# Patient Record
Sex: Female | Born: 1965 | Race: White | Hispanic: No | Marital: Married | Smoking: Never smoker
Health system: Southern US, Community
[De-identification: ages and names within clinical notes are randomized; demographics above are authoritative.]

## PROBLEM LIST (undated history)

## (undated) DIAGNOSIS — F32A Depression, unspecified: Secondary | ICD-10-CM

## (undated) DIAGNOSIS — M1990 Unspecified osteoarthritis, unspecified site: Secondary | ICD-10-CM

## (undated) DIAGNOSIS — I10 Essential (primary) hypertension: Secondary | ICD-10-CM

## (undated) DIAGNOSIS — T4145XA Adverse effect of unspecified anesthetic, initial encounter: Secondary | ICD-10-CM

## (undated) DIAGNOSIS — Z8489 Family history of other specified conditions: Secondary | ICD-10-CM

## (undated) DIAGNOSIS — F329 Major depressive disorder, single episode, unspecified: Secondary | ICD-10-CM

## (undated) DIAGNOSIS — D649 Anemia, unspecified: Secondary | ICD-10-CM

## (undated) DIAGNOSIS — K219 Gastro-esophageal reflux disease without esophagitis: Secondary | ICD-10-CM

## (undated) DIAGNOSIS — T8859XA Other complications of anesthesia, initial encounter: Secondary | ICD-10-CM

## (undated) HISTORY — PX: TONSILLECTOMY: SUR1361

---
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
## 1995-05-21 HISTORY — PX: CHOLECYSTECTOMY: SHX55

## 1996-12-14 HISTORY — PX: CARPAL TUNNEL RELEASE: SHX101

## 2002-12-14 HISTORY — PX: NASAL SINUS SURGERY: SHX719

## 2006-12-14 HISTORY — PX: GASTRIC BYPASS: SHX52

## 2017-06-07 ENCOUNTER — Other Ambulatory Visit: Payer: Self-pay | Admitting: Orthopedic Surgery

## 2017-06-07 DIAGNOSIS — M5416 Radiculopathy, lumbar region: Secondary | ICD-10-CM

## 2017-06-08 ENCOUNTER — Ambulatory Visit
Admission: RE | Admit: 2017-06-08 | Discharge: 2017-06-08 | Disposition: A | Discharge status: Home / Self Care | Payer: BC Managed Care – PPO | Source: Ambulatory Visit | Attending: Orthopedic Surgery | Admitting: Orthopedic Surgery

## 2017-06-08 ENCOUNTER — Encounter: Payer: Self-pay | Admitting: Radiology

## 2017-06-08 DIAGNOSIS — M5416 Radiculopathy, lumbar region: Secondary | ICD-10-CM

## 2017-06-08 MED ORDER — IOPAMIDOL (ISOVUE-M 200) INJECTION 41%
1.0000 mL | Freq: Once | INTRAMUSCULAR | Status: AC
  Administered 2017-06-08: 1 mL via EPIDURAL

## 2017-06-08 MED ORDER — METHYLPREDNISOLONE ACETATE 40 MG/ML INJ SUSP (RADIOLOG
120.0000 mg | Freq: Once | INTRAMUSCULAR | Status: AC
  Administered 2017-06-08: 120 mg via EPIDURAL

## 2017-06-08 NOTE — Discharge Instructions (Signed)

## 2017-06-28 ENCOUNTER — Other Ambulatory Visit: Payer: Self-pay | Admitting: Orthopedic Surgery

## 2017-06-30 ENCOUNTER — Encounter (HOSPITAL_COMMUNITY): Payer: Self-pay

## 2017-06-30 ENCOUNTER — Encounter (HOSPITAL_COMMUNITY)
Admission: RE | Admit: 2017-06-30 | Discharge: 2017-06-30 | Disposition: A | Discharge status: Home / Self Care | Payer: BC Managed Care – PPO | Source: Ambulatory Visit | Attending: Orthopedic Surgery | Admitting: Orthopedic Surgery

## 2017-06-30 DIAGNOSIS — Z888 Allergy status to other drugs, medicaments and biological substances status: Secondary | ICD-10-CM | POA: Diagnosis not present

## 2017-06-30 DIAGNOSIS — M48061 Spinal stenosis, lumbar region without neurogenic claudication: Secondary | ICD-10-CM | POA: Diagnosis not present

## 2017-06-30 DIAGNOSIS — M5116 Intervertebral disc disorders with radiculopathy, lumbar region: Secondary | ICD-10-CM | POA: Diagnosis not present

## 2017-06-30 DIAGNOSIS — F329 Major depressive disorder, single episode, unspecified: Secondary | ICD-10-CM | POA: Diagnosis not present

## 2017-06-30 DIAGNOSIS — M069 Rheumatoid arthritis, unspecified: Secondary | ICD-10-CM | POA: Diagnosis not present

## 2017-06-30 DIAGNOSIS — K219 Gastro-esophageal reflux disease without esophagitis: Secondary | ICD-10-CM | POA: Diagnosis not present

## 2017-06-30 DIAGNOSIS — Z79899 Other long term (current) drug therapy: Secondary | ICD-10-CM | POA: Diagnosis not present

## 2017-06-30 DIAGNOSIS — Z01818 Encounter for other preprocedural examination: Secondary | ICD-10-CM

## 2017-06-30 DIAGNOSIS — I10 Essential (primary) hypertension: Secondary | ICD-10-CM | POA: Diagnosis not present

## 2017-06-30 HISTORY — DX: Family history of other specified conditions: Z84.89

## 2017-06-30 HISTORY — DX: Gastro-esophageal reflux disease without esophagitis: K21.9

## 2017-06-30 HISTORY — DX: Unspecified osteoarthritis, unspecified site: M19.90

## 2017-06-30 HISTORY — DX: Major depressive disorder, single episode, unspecified: F32.9

## 2017-06-30 HISTORY — DX: Depression, unspecified: F32.A

## 2017-06-30 HISTORY — DX: Other complications of anesthesia, initial encounter: T88.59XA

## 2017-06-30 HISTORY — DX: Essential (primary) hypertension: I10

## 2017-06-30 HISTORY — DX: Anemia, unspecified: D64.9

## 2017-06-30 HISTORY — DX: Adverse effect of unspecified anesthetic, initial encounter: T41.45XA

## 2017-06-30 LAB — COMPREHENSIVE METABOLIC PANEL
ALT: 26 U/L (ref 14–54)
ANION GAP: 11 (ref 5–15)
AST: 34 U/L (ref 15–41)
Albumin: 4.3 g/dL (ref 3.5–5.0)
Alkaline Phosphatase: 69 U/L (ref 38–126)
BUN: 15 mg/dL (ref 6–20)
CHLORIDE: 101 mmol/L (ref 101–111)
CO2: 28 mmol/L (ref 22–32)
Calcium: 9.4 mg/dL (ref 8.9–10.3)
Creatinine, Ser: 0.91 mg/dL (ref 0.44–1.00)
Glucose, Bld: 91 mg/dL (ref 65–99)
POTASSIUM: 3.8 mmol/L (ref 3.5–5.1)
SODIUM: 140 mmol/L (ref 135–145)
Total Bilirubin: 0.6 mg/dL (ref 0.3–1.2)
Total Protein: 7.1 g/dL (ref 6.5–8.1)

## 2017-06-30 LAB — URINALYSIS, ROUTINE W REFLEX MICROSCOPIC
Bacteria, UA: NONE SEEN
Bilirubin Urine: NEGATIVE
GLUCOSE, UA: NEGATIVE mg/dL
HGB URINE DIPSTICK: NEGATIVE
Ketones, ur: NEGATIVE mg/dL
Nitrite: NEGATIVE
PROTEIN: NEGATIVE mg/dL
SPECIFIC GRAVITY, URINE: 1.003 — AB (ref 1.005–1.030)
SQUAMOUS EPITHELIAL / LPF: NONE SEEN
pH: 5 (ref 5.0–8.0)

## 2017-06-30 LAB — SURGICAL PCR SCREEN
MRSA, PCR: NEGATIVE
STAPHYLOCOCCUS AUREUS: POSITIVE — AB

## 2017-06-30 LAB — CBC WITH DIFFERENTIAL/PLATELET
Basophils Absolute: 0 10*3/uL (ref 0.0–0.1)
Basophils Relative: 0 %
EOS ABS: 0.1 10*3/uL (ref 0.0–0.7)
EOS PCT: 2 %
HCT: 44.9 % (ref 36.0–46.0)
Hemoglobin: 14.8 g/dL (ref 12.0–15.0)
LYMPHS ABS: 1.5 10*3/uL (ref 0.7–4.0)
Lymphocytes Relative: 24 %
MCH: 31.8 pg (ref 26.0–34.0)
MCHC: 33 g/dL (ref 30.0–36.0)
MCV: 96.6 fL (ref 78.0–100.0)
MONO ABS: 0.8 10*3/uL (ref 0.1–1.0)
Monocytes Relative: 13 %
Neutro Abs: 3.8 10*3/uL (ref 1.7–7.7)
Neutrophils Relative %: 61 %
PLATELETS: 254 10*3/uL (ref 150–400)
RBC: 4.65 MIL/uL (ref 3.87–5.11)
RDW: 12.8 % (ref 11.5–15.5)
WBC: 6.2 10*3/uL (ref 4.0–10.5)

## 2017-06-30 LAB — APTT: aPTT: 24 seconds (ref 24–36)

## 2017-06-30 LAB — TYPE AND SCREEN
ABO/RH(D): O POS
Antibody Screen: NEGATIVE

## 2017-06-30 LAB — PROTIME-INR
INR: 0.92
PROTHROMBIN TIME: 12.3 s (ref 11.4–15.2)

## 2017-06-30 LAB — HCG, SERUM, QUALITATIVE: Preg, Serum: NEGATIVE

## 2017-06-30 LAB — ABO/RH: ABO/RH(D): O POS

## 2017-06-30 NOTE — H&P (Signed)
PREOPERATIVE H&P  Chief Complaint: Right leg pain  HPI: Latoya Smith is a 51 y.o. 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 No past surgical history on file. Social History   Social History  . Marital status: Married    Spouse name: N/A  . Number of children: N/A  . Years of education: N/A   Social History Main Topics  . Smoking status: Not on file  . Smokeless tobacco: Not on file  . Alcohol use Not on file  . Drug use: Unknown  . Sexual activity: Not on file   Other Topics Concern  . Not on file   Social History Narrative  . No narrative on file   No family history on file. Allergies  Allergen Reactions  . Valium [Diazepam] Other (See Comments)    Cries uncontrollably    Prior to Admission medications   Medication Sig Start Date End Date Taking? Authorizing Provider  acyclovir (ZOVIRAX) 400 MG tablet Take 400 mg by mouth every other day.   Yes [provider]  buPROPion (WELLBUTRIN) 100 MG tablet Take 100 mg by mouth 2 (two) times daily.   Yes [provider]  Calcium Citrate-Vitamin D (CELEBRATE CALCIUM PLUS 500 PO) Take 1-2 tablets by mouth See admin instructions. Takes 1 tablet in morning and 2 tablet in the evening   Yes [provider]  celecoxib (CELEBREX) 200 MG capsule Take 200 mg by mouth daily.   Yes [provider]  cyclobenzaprine (FLEXERIL) 5 MG tablet Take 5 mg by mouth at bedtime.   Yes [provider]  diphenhydrAMINE (BENADRYL) 25 mg capsule Take 50 mg by mouth at bedtime.   Yes [provider]  hydrochlorothiazide (MICROZIDE) 12.5 MG capsule Take 12.5 mg by mouth daily.   Yes [provider]    HYDROcodone-acetaminophen (NORCO/VICODIN) 5-325 MG tablet take 1 to 2 tablets by mouth every 6 to  8 HOURS AS NEEDED FOR PAIN 06/24/17  Yes [provider]  leflunomide (ARAVA) 20 MG tablet Take 20 mg by mouth at bedtime.   Yes [provider]  levonorgestrel (MIRENA) 20 MCG/24HR IUD 1 each by Intrauterine route once.   Yes [provider]  loratadine (CLARITIN) 10 MG tablet Take 10 mg by mouth daily.   Yes [provider]  losartan (COZAAR) 50 MG tablet Take 25 mg by mouth daily. Takes 0.5 tablets   Yes [provider]  Multiple Vitamins-Minerals (CELEBRATE MULTI-COMPLETE 18 PO) Take 1 tablet by mouth 2 (two) times daily.   Yes [provider]  omeprazole (PRILOSEC) 20 MG capsule Take 20 mg by mouth daily.   Yes [provider]  predniSONE (DELTASONE) 5 MG tablet Take 5 mg by mouth daily with breakfast.   Yes [provider]  Tocilizumab (ACTEMRA IV) Inject 8 mg/kg into the vein every 30 (thirty) days. Last infusion was given 05-26-17   Yes [provider]  zolpidem (AMBIEN) 10 MG tablet Take 10 mg by mouth at bedtime.   Yes [provider]     All other systems have been reviewed and were otherwise negative with the exception of those mentioned in the HPI and as above.  Physical Exam: There were no vitals filed for this visit.  General: Alert, no acute distress Cardiovascular: No pedal edema Respiratory: No cyanosis, no use of accessory musculature Skin: No lesions in the area of chief complaint Neurologic: Sensation intact distally Psychiatric: Patient is competent for consent with normal mood and affect Lymphatic: No axillary or cervical lymphadenopathy  MUSCULOSKELETAL: + SLR on right   Assessment/Plan: RIGHT LEG PAIN Plan for Procedure(s): RIGHT SIDED LUMBAR 4-5 MICRODISECTOMY.   Emilee Hero, MD 06/30/2017 12:05 PM

## 2017-06-30 NOTE — Pre-Procedure Instructions (Addendum)
ANDREW SORIA  06/30/2017      [id number] [name], [place] - [address] [place] [address] Phone: [phone] Fax: [phone]    Your procedure is scheduled on 07/01/18  Report to [facility] Admitting at 1150 A.M.  Call this number if you have problems the morning of surgery:  [phone]   Remember:  Do not eat food or drink liquids after midnight.  Take these medicines the morning of surgery with A SIP OF WATER    Bupropion(wellbutrin),omeprazole(prilosec), Hydrocodone if needed,   Acyclovir if needed, prednisone  STOP all herbel meds, nsaids (aleve,naproxen,advil,ibuprofen)   prior to surgery starting NOW including all vitamins/supplements,aspirin,celebrex   Do not wear jewelry, make-up or nail polish.  Do not wear lotions, powders, or perfumes, or deoderant.  Do not shave 48 hours prior to surgery.  Men may shave face and neck.  Do not bring valuables to the hospital.  [facility] is not responsible for any belongings or valuables.  Contacts, dentures or bridgework may not be worn into surgery.  Leave your suitcase in the car.  After surgery it may be brought to your room.  For patients admitted to the hospital, discharge time will be determined by your treatment team.  Patients discharged the day of surgery will not be allowed to drive home.   Special instructions:   Special Instructions: [facility] - Preparing for Surgery  Before surgery, you can play an important role.  Because skin is not sterile, your skin needs to be as free of germs as possible.  You can reduce the number of germs on you skin by washing with CHG (chlorahexidine gluconate) soap before surgery.  CHG is an antiseptic cleaner which kills germs and bonds with the skin to continue killing germs even after washing.  Please DO NOT use if you have an allergy to CHG or antibacterial soaps.  If your skin becomes reddened/irritated  stop using the CHG and inform your nurse when you arrive at Short Stay.  Do not shave (including legs and underarms) for at least 48 hours prior to the first CHG shower.  You may shave your face.  Please follow these instructions carefully:   1.  Shower with CHG Soap the night before surgery and the morning of Surgery.  2.  If you choose to wash your hair, wash your hair first as usual with your normal shampoo.  3.  After you shampoo, rinse your hair and body thoroughly to remove the Shampoo.  4.  Use CHG as you would any other liquid soap.  You can apply chg directly  to the skin and wash gently with scrungie or a clean washcloth.  5.  Apply the CHG Soap to your body ONLY FROM THE NECK DOWN.  Do not use on open wounds or open sores.  Avoid contact with your eyes ears, mouth and genitals (private parts).  Wash genitals (private parts)       with your normal soap.  6.  Wash thoroughly, paying special attention to the area where your surgery will be performed.  7.  Thoroughly rinse your body with warm water from the neck down.  8.  DO NOT shower/wash with your normal soap after using and rinsing off the CHG Soap.  9.  Pat yourself dry with a clean towel.            10.  Wear clean pajamas.  11.  Place clean sheets on your bed the night of your first shower and do not sleep with pets.  Day of Surgery  Do not apply any lotions/deodorants the morning of surgery.  Please wear clean clothes to the hospital/surgery center.  Please read over the fact sheets that you were given.

## 2017-07-01 ENCOUNTER — Ambulatory Visit (HOSPITAL_COMMUNITY): Payer: BC Managed Care – PPO

## 2017-07-01 ENCOUNTER — Ambulatory Visit (HOSPITAL_COMMUNITY): Payer: BC Managed Care – PPO | Admitting: Anesthesiology

## 2017-07-01 ENCOUNTER — Ambulatory Visit (HOSPITAL_COMMUNITY)
Admission: RE | Disposition: A | Discharge status: Home / Self Care | Payer: Self-pay | Source: Ambulatory Visit | Attending: Orthopedic Surgery

## 2017-07-01 ENCOUNTER — Ambulatory Visit (HOSPITAL_COMMUNITY)
Admission: RE | Admit: 2017-07-01 | Discharge: 2017-07-01 | Disposition: A | Discharge status: Home / Self Care | Payer: BC Managed Care – PPO | Source: Ambulatory Visit | Attending: Orthopedic Surgery | Admitting: Orthopedic Surgery

## 2017-07-01 ENCOUNTER — Encounter (HOSPITAL_COMMUNITY): Payer: Self-pay | Admitting: Anesthesiology

## 2017-07-01 DIAGNOSIS — M5116 Intervertebral disc disorders with radiculopathy, lumbar region: Secondary | ICD-10-CM | POA: Insufficient documentation

## 2017-07-01 DIAGNOSIS — M5410 Radiculopathy, site unspecified: Secondary | ICD-10-CM

## 2017-07-01 DIAGNOSIS — F329 Major depressive disorder, single episode, unspecified: Secondary | ICD-10-CM | POA: Insufficient documentation

## 2017-07-01 DIAGNOSIS — K219 Gastro-esophageal reflux disease without esophagitis: Secondary | ICD-10-CM | POA: Insufficient documentation

## 2017-07-01 DIAGNOSIS — Z419 Encounter for procedure for purposes other than remedying health state, unspecified: Secondary | ICD-10-CM

## 2017-07-01 DIAGNOSIS — M069 Rheumatoid arthritis, unspecified: Secondary | ICD-10-CM | POA: Insufficient documentation

## 2017-07-01 DIAGNOSIS — M48061 Spinal stenosis, lumbar region without neurogenic claudication: Secondary | ICD-10-CM | POA: Insufficient documentation

## 2017-07-01 DIAGNOSIS — Z888 Allergy status to other drugs, medicaments and biological substances status: Secondary | ICD-10-CM | POA: Insufficient documentation

## 2017-07-01 DIAGNOSIS — I10 Essential (primary) hypertension: Secondary | ICD-10-CM | POA: Insufficient documentation

## 2017-07-01 DIAGNOSIS — Z79899 Other long term (current) drug therapy: Secondary | ICD-10-CM | POA: Insufficient documentation

## 2017-07-01 HISTORY — PX: LUMBAR LAMINECTOMY: SHX95

## 2017-07-01 SURGERY — MICRODISCECTOMY LUMBAR LAMINECTOMY
Anesthesia: General | Site: Back | Laterality: Right

## 2017-07-01 MED ORDER — HYDROMORPHONE HCL 1 MG/ML IJ SOLN
INTRAMUSCULAR | Status: AC
  Filled 2017-07-01: qty 0.5

## 2017-07-01 MED ORDER — FENTANYL CITRATE (PF) 100 MCG/2ML IJ SOLN
INTRAMUSCULAR | Status: DC | PRN
  Administered 2017-07-01: 100 ug via INTRAVENOUS
  Administered 2017-07-01 (×3): 50 ug via INTRAVENOUS

## 2017-07-01 MED ORDER — PHENYLEPHRINE HCL 10 MG/ML IJ SOLN
INTRAMUSCULAR | Status: DC | PRN
  Administered 2017-07-01 (×5): 80 ug via INTRAVENOUS

## 2017-07-01 MED ORDER — ROCURONIUM BROMIDE 100 MG/10ML IV SOLN
INTRAVENOUS | Status: DC | PRN
  Administered 2017-07-01: 5 mg via INTRAVENOUS
  Administered 2017-07-01: 45 mg via INTRAVENOUS

## 2017-07-01 MED ORDER — METHYLPREDNISOLONE ACETATE 40 MG/ML IJ SUSP
INTRAMUSCULAR | Status: DC | PRN
  Administered 2017-07-01: 40 mg

## 2017-07-01 MED ORDER — DEXAMETHASONE SODIUM PHOSPHATE 10 MG/ML IJ SOLN
INTRAMUSCULAR | Status: DC | PRN
  Administered 2017-07-01: 10 mg via INTRAVENOUS

## 2017-07-01 MED ORDER — CEFAZOLIN SODIUM-DEXTROSE 2-4 GM/100ML-% IV SOLN
2.0000 g | INTRAVENOUS | Status: AC
  Administered 2017-07-01: 2 g via INTRAVENOUS
  Filled 2017-07-01: qty 100

## 2017-07-01 MED ORDER — PROPOFOL 10 MG/ML IV BOLUS
INTRAVENOUS | Status: DC | PRN
  Administered 2017-07-01: 180 mg via INTRAVENOUS

## 2017-07-01 MED ORDER — HYDROCODONE-ACETAMINOPHEN 5-325 MG PO TABS
ORAL_TABLET | ORAL | Status: AC
  Administered 2017-07-01: 1 via ORAL
  Filled 2017-07-01: qty 1

## 2017-07-01 MED ORDER — BUPIVACAINE LIPOSOME 1.3 % IJ SUSP
INTRAMUSCULAR | Status: DC | PRN
  Administered 2017-07-01: 20 mL

## 2017-07-01 MED ORDER — HYDROCODONE-ACETAMINOPHEN 5-325 MG PO TABS
1.0000 | ORAL_TABLET | Freq: Once | ORAL | Status: AC
  Administered 2017-07-01: 1 via ORAL

## 2017-07-01 MED ORDER — THROMBIN 20000 UNITS EX SOLR
CUTANEOUS | Status: AC
  Filled 2017-07-01: qty 20000

## 2017-07-01 MED ORDER — BUPIVACAINE LIPOSOME 1.3 % IJ SUSP
20.0000 mL | Freq: Once | INTRAMUSCULAR | Status: DC
  Filled 2017-07-01: qty 20

## 2017-07-01 MED ORDER — METHYLENE BLUE 0.5 % INJ SOLN
INTRAVENOUS | Status: AC
  Filled 2017-07-01: qty 10

## 2017-07-01 MED ORDER — SUGAMMADEX SODIUM 200 MG/2ML IV SOLN
INTRAVENOUS | Status: DC | PRN
  Administered 2017-07-01: 150 mg via INTRAVENOUS

## 2017-07-01 MED ORDER — LACTATED RINGERS IV SOLN
INTRAVENOUS | Status: DC
  Administered 2017-07-01 (×2): via INTRAVENOUS

## 2017-07-01 MED ORDER — METHYLENE BLUE 0.5 % INJ SOLN
INTRAVENOUS | Status: DC | PRN
  Administered 2017-07-01: 1 mL

## 2017-07-01 MED ORDER — SCOPOLAMINE 1 MG/3DAYS TD PT72
1.0000 | MEDICATED_PATCH | TRANSDERMAL | Status: DC
  Administered 2017-07-01: 1.5 mg via TRANSDERMAL
  Filled 2017-07-01: qty 1

## 2017-07-01 MED ORDER — LIDOCAINE HCL (CARDIAC) 20 MG/ML IV SOLN
INTRAVENOUS | Status: DC | PRN
  Administered 2017-07-01: 100 mg via INTRAVENOUS

## 2017-07-01 MED ORDER — CHLORHEXIDINE GLUCONATE 4 % EX LIQD: 60.0000 mL | Freq: Once | CUTANEOUS | Status: DC

## 2017-07-01 MED ORDER — CYCLOBENZAPRINE HCL 10 MG PO TABS
ORAL_TABLET | ORAL | Status: AC
  Administered 2017-07-01: 10 mg via ORAL
  Filled 2017-07-01: qty 1

## 2017-07-01 MED ORDER — CYCLOBENZAPRINE HCL 10 MG PO TABS
10.0000 mg | ORAL_TABLET | Freq: Once | ORAL | Status: AC
  Administered 2017-07-01: 10 mg via ORAL

## 2017-07-01 MED ORDER — PHENYLEPHRINE HCL 10 MG/ML IJ SOLN
INTRAMUSCULAR | Status: DC | PRN
  Administered 2017-07-01: 30 ug/min via INTRAVENOUS

## 2017-07-01 MED ORDER — METHYLPREDNISOLONE ACETATE 40 MG/ML IJ SUSP
INTRAMUSCULAR | Status: AC
  Filled 2017-07-01: qty 1

## 2017-07-01 MED ORDER — MIDAZOLAM HCL 5 MG/5ML IJ SOLN
INTRAMUSCULAR | Status: DC | PRN
  Administered 2017-07-01: 2 mg via INTRAVENOUS

## 2017-07-01 MED ORDER — PROMETHAZINE HCL 25 MG/ML IJ SOLN: 6.2500 mg | INTRAMUSCULAR | Status: DC | PRN

## 2017-07-01 MED ORDER — HYDROMORPHONE HCL 1 MG/ML IJ SOLN
INTRAMUSCULAR | Status: AC
  Administered 2017-07-01: 0.5 mg via INTRAVENOUS
  Filled 2017-07-01: qty 1

## 2017-07-01 MED ORDER — BUPIVACAINE-EPINEPHRINE 0.25% -1:200000 IJ SOLN
INTRAMUSCULAR | Status: DC | PRN
  Administered 2017-07-01: 30 mL

## 2017-07-01 MED ORDER — ONDANSETRON HCL 4 MG/2ML IJ SOLN
INTRAMUSCULAR | Status: DC | PRN
  Administered 2017-07-01: 4 mg via INTRAVENOUS

## 2017-07-01 MED ORDER — HYDROMORPHONE HCL 1 MG/ML IJ SOLN
0.2500 mg | INTRAMUSCULAR | Status: DC | PRN
  Administered 2017-07-01 (×2): 0.5 mg via INTRAVENOUS

## 2017-07-01 SURGICAL SUPPLY — 66 items
BENZOIN TINCTURE PRP APPL 2/3 (GAUZE/BANDAGES/DRESSINGS) IMPLANT
BUR ROUND PRECISION 4.0 (BURR) ×2 IMPLANT
BUR ROUND PRECISION 4.0MM (BURR) ×1
CANISTER SUCT 3000ML PPV (MISCELLANEOUS) ×3 IMPLANT
CLOSURE STERI-STRIP 1/2X4 (GAUZE/BANDAGES/DRESSINGS) ×1
CLOSURE WOUND 1/2 X4 (GAUZE/BANDAGES/DRESSINGS)
CLSR STERI-STRIP ANTIMIC 1/2X4 (GAUZE/BANDAGES/DRESSINGS) ×2 IMPLANT
CONT SPEC 4OZ CLIKSEAL STRL BL (MISCELLANEOUS) ×3 IMPLANT
COVER SURGICAL LIGHT HANDLE (MISCELLANEOUS) ×3 IMPLANT
DRAIN CHANNEL 10F 3/8 F FF (DRAIN) IMPLANT
DRAPE POUCH INSTRU U-SHP 10X18 (DRAPES) ×6 IMPLANT
DRAPE SURG 17X23 STRL (DRAPES) ×12 IMPLANT
DURAPREP 26ML APPLICATOR (WOUND CARE) ×3 IMPLANT
ELECT BLADE 4.0 EZ CLEAN MEGAD (MISCELLANEOUS) ×3
ELECT BLADE 6.5 EXT (BLADE) IMPLANT
ELECT CAUTERY BLADE 6.4 (BLADE) ×3 IMPLANT
ELECT REM PT RETURN 9FT ADLT (ELECTROSURGICAL) ×3
ELECTRODE BLDE 4.0 EZ CLN MEGD (MISCELLANEOUS) ×1 IMPLANT
ELECTRODE REM PT RTRN 9FT ADLT (ELECTROSURGICAL) ×1 IMPLANT
EVACUATOR SILICONE 100CC (DRAIN) IMPLANT
FILTER STRAW FLUID ASPIR (MISCELLANEOUS) ×3 IMPLANT
GAUZE SPONGE 4X4 12PLY STRL (GAUZE/BANDAGES/DRESSINGS) ×3 IMPLANT
GAUZE SPONGE 4X4 16PLY XRAY LF (GAUZE/BANDAGES/DRESSINGS) ×6 IMPLANT
GLOVE BIO SURGEON STRL SZ7 (GLOVE) ×3 IMPLANT
GLOVE BIO SURGEON STRL SZ8 (GLOVE) ×3 IMPLANT
GLOVE BIOGEL PI IND STRL 7.0 (GLOVE) ×1 IMPLANT
GLOVE BIOGEL PI IND STRL 8 (GLOVE) ×1 IMPLANT
GLOVE BIOGEL PI INDICATOR 7.0 (GLOVE) ×2
GLOVE BIOGEL PI INDICATOR 8 (GLOVE) ×2
GOWN STRL REUS W/ TWL LRG LVL3 (GOWN DISPOSABLE) ×2 IMPLANT
GOWN STRL REUS W/TWL LRG LVL3 (GOWN DISPOSABLE) ×4
IV CATH 14GX2 1/4 (CATHETERS) ×3 IMPLANT
KIT BASIN OR (CUSTOM PROCEDURE TRAY) ×3 IMPLANT
KIT ROOM TURNOVER OR (KITS) ×3 IMPLANT
MARKER SKIN DUAL TIP RULER LAB (MISCELLANEOUS) ×3 IMPLANT
NEEDLE 18GX1X1/2 (RX/OR ONLY) (NEEDLE) ×3 IMPLANT
NEEDLE 22X1 1/2 (OR ONLY) (NEEDLE) ×3 IMPLANT
NEEDLE HYPO 25GX1X1/2 BEV (NEEDLE) ×3 IMPLANT
NEEDLE SPNL 18GX3.5 QUINCKE PK (NEEDLE) ×6 IMPLANT
NEEDLE SPNL 22GX3.5 QUINCKE BK (NEEDLE) IMPLANT
NS IRRIG 1000ML POUR BTL (IV SOLUTION) ×3 IMPLANT
PACK LAMINECTOMY ORTHO (CUSTOM PROCEDURE TRAY) ×3 IMPLANT
PACK UNIVERSAL I (CUSTOM PROCEDURE TRAY) ×3 IMPLANT
PAD ARMBOARD 7.5X6 YLW CONV (MISCELLANEOUS) ×6 IMPLANT
PATTIES SURGICAL .5 X.5 (GAUZE/BANDAGES/DRESSINGS) IMPLANT
PATTIES SURGICAL .5 X1 (DISPOSABLE) ×3 IMPLANT
PATTIES SURGICAL 1X1 (DISPOSABLE) IMPLANT
SPONGE SURGIFOAM ABS GEL 100 (HEMOSTASIS) ×3 IMPLANT
STRIP CLOSURE SKIN 1/2X4 (GAUZE/BANDAGES/DRESSINGS) IMPLANT
SUT ETHILON 3 0 FSL (SUTURE) IMPLANT
SUT VIC AB 0 CT1 27 (SUTURE) ×2
SUT VIC AB 0 CT1 27XBRD ANBCTR (SUTURE) ×1 IMPLANT
SUT VIC AB 0 CT2 27 (SUTURE) ×3 IMPLANT
SUT VIC AB 1 CT1 18XCR BRD 8 (SUTURE) ×1 IMPLANT
SUT VIC AB 1 CT1 8-18 (SUTURE) ×2
SUT VIC AB 2-0 CT2 18 VCP726D (SUTURE) ×3 IMPLANT
SYR 20CC LL (SYRINGE) ×3 IMPLANT
SYR BULB IRRIGATION 50ML (SYRINGE) ×3 IMPLANT
SYR CONTROL 10ML LL (SYRINGE) ×3 IMPLANT
SYR TB 1ML 26GX3/8 SAFETY (SYRINGE) ×6 IMPLANT
SYR TB 1ML LUER SLIP (SYRINGE) ×6 IMPLANT
TAPE CLOTH SURG 4X10 WHT LF (GAUZE/BANDAGES/DRESSINGS) ×3 IMPLANT
TOWEL OR 17X24 6PK STRL BLUE (TOWEL DISPOSABLE) ×3 IMPLANT
TOWEL OR 17X26 10 PK STRL BLUE (TOWEL DISPOSABLE) ×3 IMPLANT
WATER STERILE IRR 1000ML POUR (IV SOLUTION) ×3 IMPLANT
YANKAUER SUCT BULB TIP NO VENT (SUCTIONS) ×3 IMPLANT

## 2017-07-01 NOTE — Anesthesia Preprocedure Evaluation (Addendum)
Anesthesia Evaluation  Patient identified by MRN, date of birth, ID band Patient awake    Reviewed: Allergy & Precautions, NPO status , Patient's Chart, lab work & pertinent test results  History of Anesthesia Complications Negative for: history of anesthetic complications  Airway Mallampati: II  TM Distance: >3 FB Neck ROM: Full    Dental no notable dental hx. (+) Dental Advisory Given, Teeth Intact   Pulmonary neg pulmonary ROS,    Pulmonary exam normal        Cardiovascular hypertension, Pt. on medications Normal cardiovascular exam     Neuro/Psych PSYCHIATRIC DISORDERS Depression negative neurological ROS     GI/Hepatic Neg liver ROS, GERD  Medicated and Controlled,  Endo/Other  negative endocrine ROS  Renal/GU negative Renal ROS  negative genitourinary   Musculoskeletal negative musculoskeletal ROS (+) Arthritis , Rheumatoid disorders,    Abdominal   Peds negative pediatric ROS (+)  Hematology negative hematology ROS (+)   Anesthesia Other Findings   Reproductive/Obstetrics negative OB ROS                            Anesthesia Physical Anesthesia Plan  ASA: II  Anesthesia Plan: General   Post-op Pain Management:    Induction: Intravenous  PONV Risk Score and Plan: 3 and Ondansetron, Dexamethasone and Scopolamine patch - Pre-op  Airway Management Planned: Oral ETT  Additional Equipment:   Intra-op Plan:   Post-operative Plan: Extubation in OR  Informed Consent: I have reviewed the patients History and Physical, chart, labs and discussed the procedure including the risks, benefits and alternatives for the proposed anesthesia with the patient or authorized representative who has indicated his/her understanding and acceptance.   Dental advisory given  Plan Discussed with: CRNA and Anesthesiologist  Anesthesia Plan Comments:         Anesthesia Quick  Evaluation

## 2017-07-01 NOTE — Anesthesia Postprocedure Evaluation (Signed)
Anesthesia Post Note  Patient: Latoya Smith  Procedure(s) Performed: Procedure(s) (LRB): RIGHT SIDED LUMBAR 4-5 MICRODISECTOMY. (Right)     Patient location during evaluation: PACU Anesthesia Type: General Level of consciousness: sedated Pain management: pain level controlled Vital Signs Assessment: post-procedure vital signs reviewed and stable Respiratory status: spontaneous breathing and respiratory function stable Cardiovascular status: stable Anesthetic complications: no    Last Vitals:  Vitals:   07/01/17 1615 07/01/17 1630  BP: (!) 147/86   Pulse: (!) 102 100  Resp: 19   Temp:  36.4 C    Last Pain:  Vitals:   07/01/17 1630  TempSrc:   PainSc: 3                  Amery Vandenbos DANIEL

## 2017-07-01 NOTE — H&P (Signed)
PREOPERATIVE H&P  Chief Complaint: Right leg pain  HPI: Latoya Smith is a 51 y.o. 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 No past surgical history on file. Social History        Social History  . Marital status: Married    Spouse name: N/A  . Number of children: N/A  . Years of education: N/A       Social History Main Topics  . Smoking status: Not on file  . Smokeless tobacco: Not on file  . Alcohol use Not on file  . Drug use: Unknown  . Sexual activity: Not on file   Other Topics Concern  . Not on file      Social History Narrative  . No narrative on file   No family history on file.      Allergies  Allergen Reactions  . Valium [Diazepam] Other (See Comments)    Cries uncontrollably           Prior to Admission medications   Medication Sig Start Date End Date Taking? Authorizing Provider  acyclovir (ZOVIRAX) 400 MG tablet Take 400 mg by mouth every other day.   Yes [provider]  buPROPion (WELLBUTRIN) 100 MG tablet Take 100 mg by mouth 2 (two) times daily.   Yes [provider]  Calcium Citrate-Vitamin D (CELEBRATE CALCIUM PLUS 500 PO) Take 1-2 tablets by mouth See admin instructions. Takes 1 tablet in morning and 2 tablet in the evening   Yes [provider]  celecoxib (CELEBREX) 200 MG capsule Take 200 mg by mouth daily.   Yes [provider]  cyclobenzaprine (FLEXERIL) 5 MG tablet Take 5 mg by mouth at bedtime.   Yes [provider]  diphenhydrAMINE (BENADRYL) 25 mg capsule Take 50 mg by mouth at bedtime.   Yes [provider]  hydrochlorothiazide (MICROZIDE) 12.5 MG capsule Take 12.5 mg by  mouth daily.   Yes [provider]  HYDROcodone-acetaminophen (NORCO/VICODIN) 5-325 MG tablet take 1 to 2 tablets by mouth every 6 to  8 HOURS AS NEEDED FOR PAIN 06/24/17  Yes [provider]  leflunomide (ARAVA) 20 MG tablet Take 20 mg by mouth at bedtime.   Yes [provider]  levonorgestrel (MIRENA) 20 MCG/24HR IUD 1 each by Intrauterine route once.   Yes [provider]  loratadine (CLARITIN) 10 MG tablet Take 10 mg by mouth daily.   Yes [provider]  losartan (COZAAR) 50 MG tablet Take 25 mg by mouth daily. Takes 0.5 tablets   Yes [provider]  Multiple Vitamins-Minerals (CELEBRATE MULTI-COMPLETE 18 PO) Take 1 tablet by mouth 2 (two) times daily.   Yes [provider]  omeprazole (PRILOSEC) 20 MG capsule Take 20 mg by mouth daily.   Yes [provider]  predniSONE (DELTASONE) 5 MG tablet Take 5 mg by mouth daily with breakfast.   Yes [provider]  Tocilizumab (ACTEMRA IV) Inject 8 mg/kg into the vein every 30 (thirty) days. Last infusion was given 05-26-17   Yes [provider]  zolpidem (AMBIEN) 10 MG tablet Take 10 mg by mouth at bedtime.   Yes [provider]     All other  systems have been reviewed and were otherwise negative with the exception of those mentioned in the HPI and as above.  Physical Exam: There were no vitals filed for this visit.  General: Alert, no acute distress Cardiovascular: No pedal edema Respiratory: No cyanosis, no use of accessory musculature Skin: No lesions in the area of chief complaint Neurologic: Sensation intact distally Psychiatric: Patient is competent for consent with normal mood and affect Lymphatic: No axillary or cervical lymphadenopathy  MUSCULOSKELETAL: + SLR on right   Assessment/Plan: RIGHT LEG PAIN Plan for Procedure(s): RIGHT SIDED LUMBAR 4-5 MICRODISECTOMY.

## 2017-07-01 NOTE — Anesthesia Procedure Notes (Signed)
Procedure Name: Intubation Date/Time: 07/01/2017 12:16 PM Performed by: Lovie Chol Pre-anesthesia Checklist: Patient identified, Emergency Drugs available, Suction available and Patient being monitored Patient Re-evaluated:Patient Re-evaluated prior to induction Oxygen Delivery Method: Circle System Utilized Preoxygenation: Pre-oxygenation with 100% oxygen Induction Type: IV induction Ventilation: Mask ventilation without difficulty Laryngoscope Size: Miller and 2 Grade View: Grade I Tube type: Oral Tube size: 7.0 mm Number of attempts: 1 Airway Equipment and Method: Stylet and Oral airway Placement Confirmation: ETT inserted through vocal cords under direct vision,  positive ETCO2 and breath sounds checked- equal and bilateral Secured at: 21 cm Tube secured with: Tape Dental Injury: Teeth and Oropharynx as per pre-operative assessment

## 2017-07-01 NOTE — Transfer of Care (Signed)
Immediate Anesthesia Transfer of Care Note  Patient: Latoya Smith  Procedure(s) Performed: Procedure(s) with comments: RIGHT SIDED LUMBAR 4-5 MICRODISECTOMY. (Right) - RIGHT SIDED LUMBAR 4-5 MICRODISECTOMY.  TIME REQUESTED 2 HRS.   Patient Location: PACU  Anesthesia Type:General  Level of Consciousness: awake, oriented and patient cooperative  Airway & Oxygen Therapy: Patient Spontanous Breathing and Patient connected to face mask oxygen  Post-op Assessment: Report given to RN and Post -op Vital signs reviewed and stable  Post vital signs: Reviewed  Last Vitals:  Vitals:   07/01/17 0937  BP: (!) 141/88  Pulse: 87  Resp: 20  Temp: 36.7 C    Last Pain:  Vitals:   07/01/17 1001  TempSrc:   PainSc: 5          Complications: No apparent anesthesia complications

## 2017-07-02 ENCOUNTER — Encounter (HOSPITAL_COMMUNITY): Payer: Self-pay | Admitting: Orthopedic Surgery

## 2017-07-02 NOTE — Op Note (Signed)
NAME:  Latoya Smith, Latoya Smith NO.:  MEDICAL RECORD NO.:  [id number]  PHYSICIAN:  Estill Bamberg, MD           DATE OF BIRTH:  DATE OF PROCEDURE:  07/01/2017                              OPERATIVE REPORT   PREOPERATIVE DIAGNOSES: 1. L4-5 spinal stenosis. 2. 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 3. Right L5 radiculopathy.  POSTOPERATIVE DIAGNOSES: 1. L4-5 spinal stenosis. 2. 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 3. Right L5 radiculopathy.  PROCEDURES:  Right-sided L4-5 laminotomy with partial facetectomy and removal of herniated intervertebral disk fragment.  SURGEON:  Estill Bamberg, MD  ASSISTANT:  Jason Coop, PAC.  ANESTHESIA:  General endotracheal anesthesia.  COMPLICATIONS:  None.  DISPOSITION:  Stable.  ESTIMATED BLOOD LOSS:  Minimal.  INDICATIONS FOR SURGERY:  Briefly, Latoya Smith is a very pleasant 51 year old female, who did present to me with substantial pain and numbness in her right leg.  Her pain was very severe and had been worsening over the course of about the last 4 months.  I did get her set up with an MRI, which did reveal a herniated disk fragment as outlined above.  We did proceed with appropriate nonoperative treatment measures, including an epidural injection.  The injection was partially beneficial to her, but she did continue to have ongoing rather debilitating pain.  We therefore did discuss proceeding with the procedure reflected above and she did elect to proceed.  OPERATIVE DETAILS:  On July 01, 2017, the patient was brought to surgery and general endotracheal anesthesia was administered.  The patient was placed prone on a well-padded flat Jackson bed with a Wilson frame. Antibiotics were given and a time-out procedure was performed.  The back was prepped and draped in the usual sterile fashion and a small midline incision was made  overlying the L4-5 intervertebral space.  The fascia was incised just to the right of the midline in a curvilinear fashion. A self-retaining retractor was placed.  A lateral intraoperative radiograph did confirm the appropriate operative level.  I then suppressed, exposed the lamina of L4 and L5.  There was moderate spinal stenosis, which was decompressed appropriately.  The traversing right L5 nerve was identified and medially retracted.  I then placed downward pressure on the annulus at the L4-5 level, and in doing so, multiple moderate-sized disk fragments did readily declare themselves through the annular defect.  These were removed uneventfully.  In doing so, I was able to thoroughly and completely decompress the right L5 nerve.  I then explored the annulotomy defect for any additional herniated disk fragments, and there were none encountered.  I was very pleased with the decompression that I was able to accomplish.  The wound was then copiously irrigated.  20 mg of Depo-Medrol was then introduced about the epidural space.  I then closed the wound in layers using #1 Vicryl followed by 2-0 Vicryl, followed by 4-0 Monocryl.  Benzoin and Steri- Strips were applied followed by sterile dressing.  All instrument counts were correct at the termination of the procedure.  Of note, Jason Coop, was my assistant throughout surgery, and did aid in retraction, suctioning, and  closure from start to finish.     Estill Bamberg, MD     MD/MEDQ  D:  07/01/2017  T:  07/01/2017  Job:  [id number]  cc:   Rocky Morel, MD

## 2017-07-09 ENCOUNTER — Emergency Department (HOSPITAL_BASED_OUTPATIENT_CLINIC_OR_DEPARTMENT_OTHER): Payer: BC Managed Care – PPO

## 2017-07-09 ENCOUNTER — Encounter (HOSPITAL_BASED_OUTPATIENT_CLINIC_OR_DEPARTMENT_OTHER): Payer: Self-pay | Admitting: *Deleted

## 2017-07-09 ENCOUNTER — Emergency Department (HOSPITAL_BASED_OUTPATIENT_CLINIC_OR_DEPARTMENT_OTHER)
Admission: EM | Admit: 2017-07-09 | Discharge: 2017-07-09 | Disposition: A | Discharge status: Home / Self Care | Payer: BC Managed Care – PPO | Attending: Emergency Medicine | Admitting: Emergency Medicine

## 2017-07-09 DIAGNOSIS — R519 Headache, unspecified: Secondary | ICD-10-CM

## 2017-07-09 DIAGNOSIS — R51 Headache: Secondary | ICD-10-CM | POA: Insufficient documentation

## 2017-07-09 DIAGNOSIS — I10 Essential (primary) hypertension: Secondary | ICD-10-CM | POA: Diagnosis not present

## 2017-07-09 DIAGNOSIS — Z79899 Other long term (current) drug therapy: Secondary | ICD-10-CM | POA: Insufficient documentation

## 2017-07-09 MED ORDER — DEXAMETHASONE SODIUM PHOSPHATE 10 MG/ML IJ SOLN
10.0000 mg | Freq: Once | INTRAMUSCULAR | Status: AC
  Administered 2017-07-09: 10 mg via INTRAVENOUS
  Filled 2017-07-09: qty 1

## 2017-07-09 MED ORDER — KETOROLAC TROMETHAMINE 30 MG/ML IJ SOLN
30.0000 mg | Freq: Once | INTRAMUSCULAR | Status: AC
  Administered 2017-07-09: 30 mg via INTRAVENOUS
  Filled 2017-07-09: qty 1

## 2017-07-09 MED ORDER — SODIUM CHLORIDE 0.9 % IV BOLUS (SEPSIS)
500.0000 mL | Freq: Once | INTRAVENOUS | Status: AC
  Administered 2017-07-09: 500 mL via INTRAVENOUS

## 2017-07-09 MED ORDER — ONDANSETRON HCL 4 MG/2ML IJ SOLN
4.0000 mg | Freq: Once | INTRAMUSCULAR | Status: AC
  Administered 2017-07-09: 4 mg via INTRAVENOUS
  Filled 2017-07-09: qty 2

## 2017-07-09 MED ORDER — ETODOLAC 400 MG PO TABS: 400.0000 mg | ORAL_TABLET | Freq: Two times a day (BID) | ORAL | 3 refills | Status: AC

## 2017-07-09 MED ORDER — METOCLOPRAMIDE HCL 5 MG/ML IJ SOLN
10.0000 mg | Freq: Once | INTRAMUSCULAR | Status: AC
  Administered 2017-07-09: 10 mg via INTRAVENOUS
  Filled 2017-07-09: qty 2

## 2017-07-09 MED ORDER — DIPHENHYDRAMINE HCL 50 MG/ML IJ SOLN
25.0000 mg | Freq: Once | INTRAMUSCULAR | Status: AC
Start: 2017-07-09 — End: 2017-07-09
  Administered 2017-07-09: 25 mg via INTRAVENOUS
  Filled 2017-07-09: qty 1

## 2017-07-09 NOTE — ED Notes (Signed)
Patient transported to CT 

## 2017-07-09 NOTE — Discharge Instructions (Signed)
Take the pain medication as directed for pain. Do not take anymore tonight.  Follow-up with your orthopedic surgeon in the next 24-48 hours for further evaluation.  Follow-up with her primary care doctor as needed.  Return the emergency Department for any worsening headache, persistent vomiting, difficulty walking, numbness/weakness of her arms or legs, urinary or bowel accidents, numbness in her groin, fever or any other worsening or concerning symptoms.

## 2017-07-09 NOTE — ED Triage Notes (Signed)
Pt had spine surgery on 07.19.18, states that she had a HA that started 07.25.18 with nausea. Reports that she had an MRI done today of her spine and that her herniation of her L4-L5 is worse than prior to surgery. Neurosurgery denies spinal leakage causing HA.

## 2017-07-09 NOTE — ED Provider Notes (Signed)
[facility] Provider Note   CSN: [id number] Arrival date & time: 07/09/17  1903     History   Chief Complaint Chief Complaint  Patient presents with  . Headache    HPI Latoya Smith is a 51 y.o. female with recent lumbar surgery on 07/05/17 who presents with 3 days of intermittent headache. Patient reports that 3 days ago she started developing a gradually worsening headache. She reports that she initially took Tylenol with minimal improvement of headache. Patient began taking ibuprofen also and experienced periods where the headache would ease up and become improved but would return. She reports associated nausea and photophobia. Patient reports that the headache is improved when she lays down and is in the dark. She states that symptoms are worse with standing and when the light is on and improved with laying supine and rest. Patient called her orthopedic doctor yesterday for evaluation of symptoms. Patient states that orthopedics suspected that she was having migraine secondary to the stress of the surgery. She states that she was prescribed a prednisone Dosepak which she took. She also was prescribed Norco. She had minimal improvement with that or face. She was changed to Percocet with minimal improvement. 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 Patient denies any fall, trauma, injury. Patient denies any numbness/weakness of her arms or legs, saddle anesthesia, urinary or bowel incontinence, fevers, neck pain, chest pain, difficulty breathing, abdominal pain. Patient had general anesthesia. She denies any spinal tap.   The history is provided by the patient.    Past Medical History:  Diagnosis Date  . Anemia    hx  . Arthritis    rheumatoid  . Complication of anesthesia    "crying when have vailum"  . Depression   . Family history of  adverse reaction to anesthesia    valium relaxed too much "stopped breathing in recovery"  . GERD (gastroesophageal reflux disease)   . Hypertension     There are no active problems to display for this patient.   Past Surgical History:  Procedure Laterality Date  . CARPAL TUNNEL RELEASE Bilateral 1998  . CHOLECYSTECTOMY  05/21/1995  . GASTRIC BYPASS  2008  . LUMBAR LAMINECTOMY Right 07/01/2017   Procedure: RIGHT SIDED LUMBAR 4-5 MICRODISECTOMY.;  Surgeon: Estill Bamberg, MD;  Location: [facility];  Service: Orthopedics;  Laterality: Right;  RIGHT SIDED LUMBAR 4-5 MICRODISECTOMY.  TIME REQUESTED 2 HRS.   Marland Kitchen NASAL SINUS SURGERY  2004  . TONSILLECTOMY    . WISDOM TOOTH EXTRACTION  1988    OB History    No data available       Home Medications    Prior to Admission medications   Medication Sig Start Date End Date Taking? Authorizing Provider  acyclovir (ZOVIRAX) 400 MG tablet Take 400 mg by mouth every other day.    [provider]  buPROPion (WELLBUTRIN) 100 MG tablet Take 100 mg by mouth 2 (two) times daily.    [provider]  Calcium Citrate-Vitamin D (CELEBRATE CALCIUM PLUS 500 PO) Take 1-2 tablets by mouth See admin instructions. Takes 1 tablet in morning and 2 tablet in the evening    [provider]  cyclobenzaprine (FLEXERIL) 5 MG tablet Take 5 mg by mouth at bedtime.    [provider]  diphenhydrAMINE (BENADRYL) 25 mg capsule Take 50 mg by mouth at  bedtime.    [provider]  etodolac (LODINE) 400 MG tablet Take 1 tablet (400 mg total) by mouth 2 (two) times daily. 07/09/17   Maxwell Caul, PA-C  ferrous sulfate 325 (65 FE) MG tablet Take 325 mg by mouth daily with breakfast. Not sure of mg    [provider]  hydrochlorothiazide (MICROZIDE) 12.5 MG capsule Take 12.5 mg by mouth daily.    [provider]  HYDROcodone-acetaminophen (NORCO/VICODIN) 5-325 MG tablet take 1 to 2 tablets by mouth every 6 to  8 HOURS  AS NEEDED FOR PAIN 06/24/17   [provider]  leflunomide (ARAVA) 20 MG tablet Take 20 mg by mouth at bedtime.    [provider]  levonorgestrel (MIRENA) 20 MCG/24HR IUD 1 each by Intrauterine route once.    [provider]  loratadine (CLARITIN) 10 MG tablet Take 10 mg by mouth daily.    [provider]  losartan (COZAAR) 50 MG tablet Take 25 mg by mouth daily. Takes 0.5 tablets    [provider]  Multiple Vitamins-Minerals (CELEBRATE MULTI-COMPLETE 18 PO) Take 1 tablet by mouth 2 (two) times daily.    [provider]  omeprazole (PRILOSEC) 20 MG capsule Take 20 mg by mouth daily.    [provider]  predniSONE (DELTASONE) 5 MG tablet Take 5 mg by mouth daily with breakfast.    [provider]  Tocilizumab (ACTEMRA IV) Inject 8 mg/kg into the vein every 30 (thirty) days. Last infusion was given 05-26-17    [provider]  zolpidem (AMBIEN) 10 MG tablet Take 10 mg by mouth at bedtime.    [provider]    Family History History reviewed. No pertinent family history.  Social History Social History  Substance Use Topics  . Smoking status: Never Smoker  . Smokeless tobacco: Never Used  . Alcohol use Yes     Comment: occ     Allergies   Valium [diazepam]   Review of Systems Review of Systems  Constitutional: Negative for chills and fever.  Eyes: Positive for photophobia. Negative for visual disturbance.  Respiratory: Negative for cough and shortness of breath.   Cardiovascular: Negative for chest pain.  Gastrointestinal: Positive for nausea. Negative for abdominal pain and vomiting.  Musculoskeletal: Positive for back pain. Negative for neck pain.  Skin: Negative for rash.  Neurological: Positive for headaches. Negative for dizziness, weakness and numbness.     Physical Exam Updated Vital Signs BP 137/75 (BP Location: Right Arm)   Pulse 90   Temp 97.9 F (36.6 C) (Oral)   Resp 16    Ht 5\' 3"  (1.6 m)   Wt 73.5 kg (162 lb)   SpO2 98%   BMI 28.70 kg/m   Physical Exam  Constitutional: She is oriented to person, place, and time. She appears well-developed and well-nourished.  Appears very uncomfortable. Sitting in the dark room with. Towels over her eyes. Very anxious.  HENT:  Head: Normocephalic and atraumatic.  Mouth/Throat: Oropharynx is clear and moist and mucous membranes are normal.  Eyes: Pupils are equal, round, and reactive to light. Conjunctivae, EOM and lids are normal.  Neck: Full passive range of motion without pain.  Full flexion/extension and lateral movement of neck fully intact. No bony midline tenderness. No deformities or crepitus.   Cardiovascular: Normal rate, regular rhythm, normal heart sounds and normal pulses.  Exam reveals no gallop and no friction rub.   No murmur heard. Pulmonary/Chest: Effort normal and breath sounds  normal.  Abdominal: Soft. Normal appearance. There is no tenderness. There is no rigidity and no guarding.  Musculoskeletal: Normal range of motion.  No midline tenderness to T spine. Diffuse tenderness overlying the lumbar region where there is a healing surgical incision site. No deformities or crepitus. FROM of BUE and BLE without difficulty.   Neurological: She is alert and oriented to person, place, and time. GCS eye subscore is 4. GCS verbal subscore is 5. GCS motor subscore is 6.  Cranial nerves III-XII intact Follows commands, Moves all extremities  5/5 strength to BUE and BLE  Sensation intact throughout  Normal finger to nose.  No dysdiadochokinesia. No pronator drift.   No gait abnormalities  No slurred speech. No facial droop.   Skin: Skin is warm and dry. Capillary refill takes less than 2 seconds.  Surgical incision site to the lower lumbar region with staples in place. No surrounding warmth, erythema, tenderness. No active drainage.  Psychiatric: She has a normal mood and affect. Her speech is normal.    Nursing note and vitals reviewed.    ED Treatments / Results  Labs (all labs ordered are listed, but only abnormal results are displayed) Labs Reviewed - No data to display  EKG  EKG Interpretation None       Radiology Ct Head Wo Contrast  Result Date: 07/09/2017 CLINICAL DATA:  Headache after spine surgery eight days ago. Nausea. EXAM: CT HEAD WITHOUT CONTRAST TECHNIQUE: Contiguous axial images were obtained from the base of the skull through the vertex without intravenous contrast. COMPARISON:  None. FINDINGS: Brain: There is no intracranial hemorrhage, mass or evidence of acute infarction. There is no extra-axial fluid collection. Gray matter and white matter appear normal. Cerebral volume is normal for age. Brainstem and posterior fossa are unremarkable. The CSF spaces appear normal. Vascular: No hyperdense vessel or unexpected calcification. Skull: Normal. Negative for fracture or focal lesion. Sinuses/Orbits: Mild left maxillary sinus membrane thickening. No air-fluid level. Other: None. IMPRESSION: 1. Normal brain 2. Mild left maxillary sinus membrane thickening, indeterminate chronicity. Electronically Signed   By: Ellery Plunk M.D.   On: 07/09/2017 21:05    Procedures Procedures (including critical care time)  Medications Ordered in ED Medications  ondansetron (ZOFRAN) injection 4 mg (4 mg Intravenous Given 07/09/17 2017)  metoCLOPramide (REGLAN) injection 10 mg (10 mg Intravenous Given 07/09/17 2017)  diphenhydrAMINE (BENADRYL) injection 25 mg (25 mg Intravenous Given 07/09/17 2017)  ketorolac (TORADOL) 30 MG/ML injection 30 mg (30 mg Intravenous Given 07/09/17 2017)  sodium chloride 0.9 % bolus 500 mL (0 mLs Intravenous Stopped 07/09/17 2049)  dexamethasone (DECADRON) injection 10 mg (10 mg Intravenous Given 07/09/17 2216)     Initial Impression / Assessment and Plan / ED Course  I have reviewed the triage vital signs and the nursing notes.  Pertinent labs & imaging  results that were available during my care of the patient were reviewed by me and considered in my medical decision making (see chart for details).     51 year old female who presents with 3 days of gradually worsening headache. Patient is 4 days status post lumbar surgery. Patient appears uncomfortable in the room. Afebrile in the department. Patient is tachycardic and hypertensive, likely secondary to anxiety and pain. Will treat pain and reassess. No neuro deficits on exam. Consider spinal headache, especially given positional changes vs migraine. History/physical exam are not concerning for meningitis. Given lack of fever and meningismal signs, no indications for an LP at this time. Plan to  treat with migraine cocktail and IVF. Plan to obtain head CT for evaluation of acute abnormality. Discussed with Dr. Lynelle Doctor. Agrees with plan.   Head CT reviewed. Negative for any acute abnormality.  Discussed results with patient. Reevaluation. She reports improvement in headache after the pain medications. She states now is down to a 3 out of 10. She is sitting up in the bed with eyes open. Appears much more comfortable. Will plan to give additional Decadron for further relief. Plan to PO challenge in the department. Will plan to ambulate in the department.  Patient is able to drink fluids in the department without any difficulty. Repeat vitals are stable. Observed patient ambulating in the department without difficulty. Patient reports improvement in symptoms. She is ready to go home. Asking for some Toradol to go home with. Will plan to give her Lodine. Instructed her to follow-up with her orthopedic surgeon in the next 24-48 hours for further evaluation. Strict return precautions discussed. Patient expresses understanding and agreement to plan.  Final Clinical Impressions(s) / ED Diagnoses   Final diagnoses:  Acute nonintractable headache, unspecified headache type    New Prescriptions New Prescriptions    ETODOLAC (LODINE) 400 MG TABLET    Take 1 tablet (400 mg total) by mouth 2 (two) times daily.     Maxwell Caul, PA-C 07/10/17 1118    Linwood Dibbles, MD 07/13/17 [phone]

## 2017-07-13 ENCOUNTER — Other Ambulatory Visit: Payer: Self-pay | Admitting: Orthopedic Surgery

## 2017-07-13 DIAGNOSIS — M5416 Radiculopathy, lumbar region: Secondary | ICD-10-CM

## 2017-07-16 MED FILL — Thrombin For Soln 20000 Unit: CUTANEOUS | Qty: 1 | Status: AC

## 2017-10-08 ENCOUNTER — Other Ambulatory Visit: Payer: Self-pay | Admitting: Orthopedic Surgery

## 2017-10-22 NOTE — Pre-Procedure Instructions (Signed)
Latoya Smith  10/22/2017      [facility] [place], [place] - [address] [place] [address] Phone: [phone] Fax: [phone]    Your procedure is scheduled on November 15  Report to [facility] Admitting at 0900 A.M.  Call this number if you have problems the morning of surgery:  [phone]   Remember:  Do not eat food or drink liquids after midnight.  Continue all other medications as directed by your physician except follow these medication instructions before surgery   Take these medicines the morning of surgery with A SIP OF WATER  acetaminophen (TYLENOL)  buPROPion (WELLBUTRIN) cyclobenzaprine (FLEXERIL) HYDROcodone-acetaminophen (NORCO/VICODIN) loratadine (CLARITIN) predniSONE (DELTASONE) omeprazole (PRILOSEC) traMADol (ULTRAM)   7 days prior to surgery STOP taking any Aspirin (unless otherwise instructed by your surgeon), Aleve, Naproxen, Ibuprofen, Motrin, Advil, Goody's, BC's, all herbal medications, fish oil, and all vitamins    Do not wear jewelry, make-up or nail polish.  Do not wear lotions, powders, or perfumes, or deoderant.  Do not shave 48 hours prior to surgery.  Men may shave face and neck.  Do not bring valuables to the hospital.  [facility] is not responsible for any belongings or valuables.  Contacts, dentures or bridgework may not be worn into surgery.  Leave your suitcase in the car.  After surgery it may be brought to your room.  For patients admitted to the hospital, discharge time will be determined by your treatment team.  Patients discharged the day of surgery will not be allowed to drive home.    Special instructions:   [facility]- Preparing For Surgery  Before surgery, you can play an important role. Because skin is not sterile, your skin needs to be as free of germs as possible. You can reduce the number of germs on your skin by washing with CHG  (chlorahexidine gluconate) Soap before surgery.  CHG is an antiseptic cleaner which kills germs and bonds with the skin to continue killing germs even after washing.  Please do not use if you have an allergy to CHG or antibacterial soaps. If your skin becomes reddened/irritated stop using the CHG.  Do not shave (including legs and underarms) for at least 48 hours prior to first CHG shower. It is OK to shave your face.  Please follow these instructions carefully.   1. Shower the NIGHT BEFORE SURGERY and the MORNING OF SURGERY with CHG.   2. If you chose to wash your hair, wash your hair first as usual with your normal shampoo.  3. After you shampoo, rinse your hair and body thoroughly to remove the shampoo.  4. Use CHG as you would any other liquid soap. You can apply CHG directly to the skin and wash gently with a scrungie or a clean washcloth.   5. Apply the CHG Soap to your body ONLY FROM THE NECK DOWN.  Do not use on open wounds or open sores. Avoid contact with your eyes, ears, mouth and genitals (private parts). Wash Face and genitals (private parts)  with your normal soap.  6. Wash thoroughly, paying special attention to the area where your surgery will be performed.  7. Thoroughly rinse your body with warm water from the neck down.  8. DO NOT shower/wash with your normal soap after using and rinsing off the CHG Soap.  9. Pat yourself dry with a CLEAN TOWEL.  10. Wear CLEAN PAJAMAS to bed the night before  surgery, wear comfortable clothes the morning of surgery  11. Place CLEAN SHEETS on your bed the night of your first shower and DO NOT SLEEP WITH PETS.    Day of Surgery: Do not apply any deodorants/lotions. Please wear clean clothes to the hospital/surgery center.      Please read over the following fact sheets that you were given.

## 2017-10-25 ENCOUNTER — Encounter (HOSPITAL_COMMUNITY)
Admission: RE | Admit: 2017-10-25 | Discharge: 2017-10-25 | Disposition: A | Discharge status: Home / Self Care | Payer: BC Managed Care – PPO | Source: Ambulatory Visit | Attending: Orthopedic Surgery | Admitting: Orthopedic Surgery

## 2017-10-25 ENCOUNTER — Other Ambulatory Visit: Payer: Self-pay

## 2017-10-25 ENCOUNTER — Ambulatory Visit (HOSPITAL_COMMUNITY)
Admission: RE | Admit: 2017-10-25 | Discharge: 2017-10-25 | Disposition: A | Discharge status: Home / Self Care | Payer: BC Managed Care – PPO | Source: Ambulatory Visit | Attending: Orthopedic Surgery | Admitting: Orthopedic Surgery

## 2017-10-25 ENCOUNTER — Encounter (HOSPITAL_COMMUNITY): Payer: Self-pay

## 2017-10-25 DIAGNOSIS — Z01818 Encounter for other preprocedural examination: Secondary | ICD-10-CM | POA: Diagnosis present

## 2017-10-25 LAB — URINALYSIS, ROUTINE W REFLEX MICROSCOPIC
Bacteria, UA: NONE SEEN
Bilirubin Urine: NEGATIVE
Glucose, UA: NEGATIVE mg/dL
Hgb urine dipstick: NEGATIVE
Ketones, ur: NEGATIVE mg/dL
Nitrite: NEGATIVE
PROTEIN: NEGATIVE mg/dL
Specific Gravity, Urine: 1.003 — ABNORMAL LOW (ref 1.005–1.030)
pH: 5 (ref 5.0–8.0)

## 2017-10-25 LAB — CBC WITH DIFFERENTIAL/PLATELET
BASOS PCT: 1 %
Basophils Absolute: 0.1 10*3/uL (ref 0.0–0.1)
EOS ABS: 0.1 10*3/uL (ref 0.0–0.7)
EOS PCT: 3 %
HEMATOCRIT: 45.7 % (ref 36.0–46.0)
HEMOGLOBIN: 15 g/dL (ref 12.0–15.0)
Lymphocytes Relative: 46 %
Lymphs Abs: 1.6 10*3/uL (ref 0.7–4.0)
MCH: 31.6 pg (ref 26.0–34.0)
MCHC: 32.8 g/dL (ref 30.0–36.0)
MCV: 96.2 fL (ref 78.0–100.0)
MONO ABS: 0.7 10*3/uL (ref 0.1–1.0)
Monocytes Relative: 20 %
NEUTROS ABS: 1.1 10*3/uL — AB (ref 1.7–7.7)
Neutrophils Relative %: 30 %
Platelets: 230 10*3/uL (ref 150–400)
RBC: 4.75 MIL/uL (ref 3.87–5.11)
RDW: 12.6 % (ref 11.5–15.5)
WBC: 3.5 10*3/uL — ABNORMAL LOW (ref 4.0–10.5)

## 2017-10-25 LAB — APTT: APTT: 25 s (ref 24–36)

## 2017-10-25 LAB — COMPREHENSIVE METABOLIC PANEL
ALK PHOS: 72 U/L (ref 38–126)
ALT: 24 U/L (ref 14–54)
AST: 34 U/L (ref 15–41)
Albumin: 3.9 g/dL (ref 3.5–5.0)
Anion gap: 8 (ref 5–15)
BILIRUBIN TOTAL: 0.7 mg/dL (ref 0.3–1.2)
BUN: 6 mg/dL (ref 6–20)
CALCIUM: 8.8 mg/dL — AB (ref 8.9–10.3)
CO2: 30 mmol/L (ref 22–32)
CREATININE: 0.91 mg/dL (ref 0.44–1.00)
Chloride: 102 mmol/L (ref 101–111)
GFR calc non Af Amer: >60 mL/min (ref >60)
GLUCOSE: 84 mg/dL (ref 65–99)
Potassium: 3.5 mmol/L (ref 3.5–5.1)
SODIUM: 140 mmol/L (ref 135–145)
TOTAL PROTEIN: 6.7 g/dL (ref 6.5–8.1)

## 2017-10-25 LAB — PROTIME-INR
INR: 1
Prothrombin Time: 13.1 seconds (ref 11.4–15.2)

## 2017-10-25 LAB — TYPE AND SCREEN
ABO/RH(D): O POS
ANTIBODY SCREEN: NEGATIVE

## 2017-10-25 LAB — SURGICAL PCR SCREEN
MRSA, PCR: NEGATIVE
STAPHYLOCOCCUS AUREUS: POSITIVE — AB

## 2017-10-25 LAB — HCG, SERUM, QUALITATIVE: PREG SERUM: NEGATIVE

## 2017-10-25 NOTE — Pre-Procedure Instructions (Signed)
Lai Hendriks Stock  10/25/2017      [facility] [name], [place] - [address] [place] [address] Phone: [phone] Fax: [phone]    Your procedure is scheduled on November 15  Report to [facility] Admitting at 0900 A.M.  Call this number if you have problems the morning of surgery:  [phone]   Remember:  Do not eat food or drink liquids after midnight.  Continue all other medications as directed by your physician except follow these medication instructions before surgery   Take these medicines the morning of surgery with A SIP OF WATER  buPROPion (WELLBUTRIN) cyclobenzaprine (FLEXERIL) HYDROcodone-acetaminophen (NORCO/VICODIN)/or tramadol if needed predniSONE (DELTASONE) omeprazole (PRILOSEC)  Today 10/25/17 STOP taking any Aspirin (unless otherwise instructed by your surgeon), Aleve, Naproxen, Ibuprofen, Motrin, Advil, Goody's, BC's, all herbal medications, fish oil, and all vitamins,celebrex    Do not wear jewelry, make-up or nail polish.  Do not wear lotions, powders, or perfumes, or deoderant.  Do not shave 48 hours prior to surgery.  Men may shave face and neck.  Do not bring valuables to the hospital.  [facility] is not responsible for any belongings or valuables.  Contacts, dentures or bridgework may not be worn into surgery.  Leave your suitcase in the car.  After surgery it may be brought to your room.  For patients admitted to the hospital, discharge time will be determined by your treatment team.  Patients discharged the day of surgery will not be allowed to drive home.    Special instructions:   [facility]- Preparing For Surgery  Before surgery, you can play an important role. Because skin is not sterile, your skin needs to be as free of germs as possible. You can reduce the number of germs on your skin by washing with CHG (chlorahexidine gluconate) Soap before surgery.  CHG  is an antiseptic cleaner which kills germs and bonds with the skin to continue killing germs even after washing.  Please do not use if you have an allergy to CHG or antibacterial soaps. If your skin becomes reddened/irritated stop using the CHG.  Do not shave (including legs and underarms) for at least 48 hours prior to first CHG shower. It is OK to shave your face.  Please follow these instructions carefully.   1. Shower the NIGHT BEFORE SURGERY and the MORNING OF SURGERY with CHG.   2. If you chose to wash your hair, wash your hair first as usual with your normal shampoo.  3. After you shampoo, rinse your hair and body thoroughly to remove the shampoo.  4. Use CHG as you would any other liquid soap. You can apply CHG directly to the skin and wash gently with a scrungie or a clean washcloth.   5. Apply the CHG Soap to your body ONLY FROM THE NECK DOWN.  Do not use on open wounds or open sores. Avoid contact with your eyes, ears, mouth and genitals (private parts). Wash Face and genitals (private parts)  with your normal soap.  6. Wash thoroughly, paying special attention to the area where your surgery will be performed.  7. Thoroughly rinse your body with warm water from the neck down.  8. DO NOT shower/wash with your normal soap after using and rinsing off the CHG Soap.  9. Pat yourself dry with a CLEAN TOWEL.  10. Wear CLEAN PAJAMAS to bed the night before surgery, wear comfortable clothes the morning of surgery  11. Place CLEAN SHEETS on your bed the night of your first shower and DO NOT SLEEP WITH PETS.    Day of Surgery: Do not apply any deodorants/lotions. Please wear clean clothes to the hospital/surgery center.      Please read over the fact sheets that you were given.

## 2017-10-25 NOTE — Pre-Procedure Instructions (Signed)
  Latoya Smith  10/25/2017      [facility] [place], [place] - [address] [place]  [address] Phone: [phone] Fax: [phone]    Your procedure is scheduled on November 15  Report to [facility] Admitting at 0900 A.M.  Call this number if you have problems the morning of surgery:  [phone]   Remember:  Do not eat food or drink liquids after midnight.  Continue all other medications as directed by your physician except follow these medication instructions before surgery   Take these medicines the morning of surgery with A SIP OF WATER  buPROPion (WELLBUTRIN) cyclobenzaprine (FLEXERIL) HYDROcodone-acetaminophen (NORCO/VICODIN)/or tramadol if needed predniSONE (DELTASONE) omeprazole (PRILOSEC)  Today 10/25/17 STOP taking any Aspirin (unless otherwise instructed by your surgeon), Aleve, Naproxen, Ibuprofen, Motrin, Advil, Goody's, BC's, all herbal medications, fish oil, and all vitamins,celebrex    Do not wear jewelry, make-up or nail polish.  Do not wear lotions, powders, or perfumes, or deoderant.  Do not shave 48 hours prior to surgery.  Men may shave face and neck.  Do not bring valuables to the hospital.  [facility] is not responsible for any belongings or valuables.  Contacts, dentures or bridgework may not be worn into surgery.  Leave your suitcase in the car.  After surgery it may be brought to your room.  For patients admitted to the hospital, discharge time will be determined by your treatment team.  Patients discharged the day of surgery will not be allowed to drive home.    Special instructions:   - Preparing For Surgery  Before surgery, you can play an important role. Because skin is not sterile, your skin needs to be as free of germs as possible. You can reduce the number of germs on your skin by washing with CHG (chlorahexidine gluconate) Soap before surgery.  CHG  is an antiseptic cleaner which kills germs and bonds with the skin to continue killing germs even after washing.  Please do not use if you have an allergy to CHG or antibacterial soaps. If your skin becomes reddened/irritated stop using the CHG.  Do not shave (including legs and underarms) for at least 48 hours prior to first CHG shower. It is OK to shave your face.  Please follow these instructions carefully.   1. Shower the NIGHT BEFORE SURGERY and the MORNING OF SURGERY with CHG.   2. If you chose to wash your hair, wash your hair first as usual with your normal shampoo.  3. After you shampoo, rinse your hair and body thoroughly to remove the shampoo.  4. Use CHG as you would any other liquid soap. You can apply CHG directly to the skin and wash gently with a scrungie or a clean washcloth.   5. Apply the CHG Soap to your body ONLY FROM THE NECK DOWN.  Do not use on open wounds or open sores. Avoid contact with your eyes, ears, mouth and genitals (private parts). Wash Face and genitals (private parts)  with your normal soap.  6. Wash thoroughly, paying special attention to the area where your surgery will be performed.  7. Thoroughly rinse your body with warm water from the neck down.  8. DO NOT shower/wash with your normal soap after using and rinsing off the CHG Soap.  9. Pat yourself dry with a CLEAN TOWEL.  10. Wear CLEAN PAJAMAS to bed the night before surgery, wear comfortable clothes the morning of surgery    11. Place CLEAN SHEETS on your bed the night of your first shower and DO NOT SLEEP WITH PETS.    Day of Surgery: Do not apply any deodorants/lotions. Please wear clean clothes to the hospital/surgery center.      Please read over the fact sheets that you were given.

## 2017-10-25 NOTE — Progress Notes (Signed)
instucted to stop actemra injection until after christmas but not d/c'd arava. To see dr Yevette Edwards before surgery .will ask him.

## 2017-10-28 ENCOUNTER — Inpatient Hospital Stay (HOSPITAL_COMMUNITY): Payer: BC Managed Care – PPO | Admitting: Anesthesiology

## 2017-10-28 ENCOUNTER — Encounter (HOSPITAL_COMMUNITY)
Admission: RE | Disposition: A | Discharge status: Home / Self Care | Payer: Self-pay | Source: Ambulatory Visit | Attending: Orthopedic Surgery

## 2017-10-28 ENCOUNTER — Encounter (HOSPITAL_COMMUNITY): Payer: Self-pay | Admitting: Urology

## 2017-10-28 ENCOUNTER — Inpatient Hospital Stay (HOSPITAL_COMMUNITY): Payer: BC Managed Care – PPO

## 2017-10-28 ENCOUNTER — Inpatient Hospital Stay (HOSPITAL_COMMUNITY)
Admission: RE | Admit: 2017-10-28 | Discharge: 2017-10-29 | DRG: 460 | Disposition: A | Discharge status: Home / Self Care | Payer: BC Managed Care – PPO | Source: Ambulatory Visit | Attending: Orthopedic Surgery | Admitting: Orthopedic Surgery

## 2017-10-28 DIAGNOSIS — M79606 Pain in leg, unspecified: Secondary | ICD-10-CM | POA: Diagnosis present

## 2017-10-28 DIAGNOSIS — M5116 Intervertebral disc disorders with radiculopathy, lumbar region: Principal | ICD-10-CM | POA: Diagnosis present

## 2017-10-28 DIAGNOSIS — M5410 Radiculopathy, site unspecified: Secondary | ICD-10-CM | POA: Diagnosis present

## 2017-10-28 DIAGNOSIS — M069 Rheumatoid arthritis, unspecified: Secondary | ICD-10-CM | POA: Diagnosis present

## 2017-10-28 DIAGNOSIS — Z7952 Long term (current) use of systemic steroids: Secondary | ICD-10-CM

## 2017-10-28 DIAGNOSIS — K219 Gastro-esophageal reflux disease without esophagitis: Secondary | ICD-10-CM | POA: Diagnosis present

## 2017-10-28 DIAGNOSIS — Z885 Allergy status to narcotic agent status: Secondary | ICD-10-CM

## 2017-10-28 DIAGNOSIS — I10 Essential (primary) hypertension: Secondary | ICD-10-CM | POA: Diagnosis present

## 2017-10-28 DIAGNOSIS — Z9884 Bariatric surgery status: Secondary | ICD-10-CM

## 2017-10-28 DIAGNOSIS — Z419 Encounter for procedure for purposes other than remedying health state, unspecified: Secondary | ICD-10-CM

## 2017-10-28 DIAGNOSIS — F329 Major depressive disorder, single episode, unspecified: Secondary | ICD-10-CM | POA: Diagnosis present

## 2017-10-28 DIAGNOSIS — M5416 Radiculopathy, lumbar region: Secondary | ICD-10-CM

## 2017-10-28 DIAGNOSIS — Z9049 Acquired absence of other specified parts of digestive tract: Secondary | ICD-10-CM

## 2017-10-28 DIAGNOSIS — Z888 Allergy status to other drugs, medicaments and biological substances status: Secondary | ICD-10-CM

## 2017-10-28 SURGERY — POSTERIOR LUMBAR FUSION 1 LEVEL
Anesthesia: General | Site: Spine Lumbar | Laterality: Left

## 2017-10-28 MED ORDER — BISACODYL 5 MG PO TBEC: 5.0000 mg | DELAYED_RELEASE_TABLET | Freq: Every day | ORAL | Status: DC | PRN

## 2017-10-28 MED ORDER — ONDANSETRON HCL 4 MG/2ML IJ SOLN
INTRAMUSCULAR | Status: DC | PRN
  Administered 2017-10-28: 4 mg via INTRAVENOUS

## 2017-10-28 MED ORDER — PHENYLEPHRINE HCL 10 MG/ML IJ SOLN
INTRAVENOUS | Status: DC | PRN
  Administered 2017-10-28: 20 ug/min via INTRAVENOUS

## 2017-10-28 MED ORDER — ACETAMINOPHEN 650 MG RE SUPP: 650.0000 mg | RECTAL | Status: DC | PRN

## 2017-10-28 MED ORDER — PROPOFOL 500 MG/50ML IV EMUL
INTRAVENOUS | Status: DC | PRN
  Administered 2017-10-28: 75 ug/kg/min via INTRAVENOUS

## 2017-10-28 MED ORDER — MEPERIDINE HCL 25 MG/ML IJ SOLN: 6.2500 mg | INTRAMUSCULAR | Status: DC | PRN

## 2017-10-28 MED ORDER — LOSARTAN POTASSIUM 50 MG PO TABS
25.0000 mg | ORAL_TABLET | Freq: Every day | ORAL | Status: DC
  Administered 2017-10-29: 25 mg via ORAL
  Filled 2017-10-28: qty 1

## 2017-10-28 MED ORDER — THROMBIN 20000 UNITS EX KIT
PACK | CUTANEOUS | Status: AC
  Filled 2017-10-28: qty 1

## 2017-10-28 MED ORDER — METHYLENE BLUE 0.5 % INJ SOLN
INTRAVENOUS | Status: DC | PRN
  Administered 2017-10-28: 1 mL

## 2017-10-28 MED ORDER — PREDNISONE 5 MG PO TABS
5.0000 mg | ORAL_TABLET | Freq: Every day | ORAL | Status: DC
  Administered 2017-10-29: 5 mg via ORAL
  Filled 2017-10-28: qty 1

## 2017-10-28 MED ORDER — MUPIROCIN 2 % EX OINT
1.0000 "application " | TOPICAL_OINTMENT | Freq: Two times a day (BID) | CUTANEOUS | Status: DC
  Administered 2017-10-28: 1 via NASAL
  Filled 2017-10-28: qty 22

## 2017-10-28 MED ORDER — FERROUS SULFATE 325 (65 FE) MG PO TABS: 325.0000 mg | ORAL_TABLET | Freq: Every day | ORAL | Status: DC

## 2017-10-28 MED ORDER — PHENYLEPHRINE HCL 10 MG/ML IJ SOLN
INTRAMUSCULAR | Status: DC | PRN
  Administered 2017-10-28: 120 ug via INTRAVENOUS
  Administered 2017-10-28 (×2): 80 ug via INTRAVENOUS

## 2017-10-28 MED ORDER — SENNOSIDES-DOCUSATE SODIUM 8.6-50 MG PO TABS: 1.0000 | ORAL_TABLET | Freq: Every evening | ORAL | Status: DC | PRN

## 2017-10-28 MED ORDER — DIPHENHYDRAMINE HCL 25 MG PO CAPS
50.0000 mg | ORAL_CAPSULE | Freq: Every day | ORAL | Status: DC
  Administered 2017-10-28: 50 mg via ORAL
  Filled 2017-10-28: qty 2

## 2017-10-28 MED ORDER — ZOLPIDEM TARTRATE 5 MG PO TABS
5.0000 mg | ORAL_TABLET | Freq: Every day | ORAL | Status: DC
  Administered 2017-10-28: 5 mg via ORAL
  Filled 2017-10-28: qty 1

## 2017-10-28 MED ORDER — CEFAZOLIN SODIUM-DEXTROSE 2-4 GM/100ML-% IV SOLN
INTRAVENOUS | Status: AC
  Filled 2017-10-28: qty 100

## 2017-10-28 MED ORDER — HYDROMORPHONE HCL 1 MG/ML IJ SOLN
INTRAMUSCULAR | Status: AC
  Administered 2017-10-28: 0.5 mg via INTRAVENOUS
  Filled 2017-10-28: qty 1

## 2017-10-28 MED ORDER — ZOLPIDEM TARTRATE 5 MG PO TABS: 5.0000 mg | ORAL_TABLET | Freq: Every evening | ORAL | Status: DC | PRN

## 2017-10-28 MED ORDER — ACYCLOVIR 400 MG PO TABS
400.0000 mg | ORAL_TABLET | ORAL | Status: DC
  Administered 2017-10-28: 400 mg via ORAL
  Filled 2017-10-28: qty 1

## 2017-10-28 MED ORDER — ALUM & MAG HYDROXIDE-SIMETH 200-200-20 MG/5ML PO SUSP: 30.0000 mL | Freq: Four times a day (QID) | ORAL | Status: DC | PRN

## 2017-10-28 MED ORDER — IRON 28 MG PO TABS: ORAL_TABLET | Freq: Every day | ORAL | Status: DC

## 2017-10-28 MED ORDER — ROCURONIUM BROMIDE 100 MG/10ML IV SOLN
INTRAVENOUS | Status: DC | PRN
  Administered 2017-10-28: 10 mg via INTRAVENOUS
  Administered 2017-10-28: 50 mg via INTRAVENOUS

## 2017-10-28 MED ORDER — METHOCARBAMOL 500 MG PO TABS
ORAL_TABLET | ORAL | Status: AC
  Filled 2017-10-28: qty 1

## 2017-10-28 MED ORDER — SODIUM CHLORIDE 0.9 % IV SOLN
INTRAVENOUS | Status: DC | PRN
  Administered 2017-10-28: 15:00:00 via INTRAVENOUS

## 2017-10-28 MED ORDER — ONDANSETRON HCL 4 MG/2ML IJ SOLN: 4.0000 mg | Freq: Once | INTRAMUSCULAR | Status: DC | PRN

## 2017-10-28 MED ORDER — PHENOL 1.4 % MT LIQD: 1.0000 | OROMUCOSAL | Status: DC | PRN

## 2017-10-28 MED ORDER — METHYLENE BLUE 0.5 % INJ SOLN
INTRAVENOUS | Status: AC
  Filled 2017-10-28: qty 10

## 2017-10-28 MED ORDER — LEVONORGESTREL 20 MCG/24HR IU IUD: 1.0000 | INTRAUTERINE_SYSTEM | Freq: Once | INTRAUTERINE | Status: DC

## 2017-10-28 MED ORDER — ARTIFICIAL TEARS OPHTHALMIC OINT
TOPICAL_OINTMENT | OPHTHALMIC | Status: DC | PRN
  Administered 2017-10-28: 1 via OPHTHALMIC

## 2017-10-28 MED ORDER — LACTATED RINGERS IV SOLN
INTRAVENOUS | Status: DC | PRN
  Administered 2017-10-28: 12:00:00 via INTRAVENOUS

## 2017-10-28 MED ORDER — MIDAZOLAM HCL 2 MG/2ML IJ SOLN
INTRAMUSCULAR | Status: AC
  Filled 2017-10-28: qty 2

## 2017-10-28 MED ORDER — PANTOPRAZOLE SODIUM 40 MG IV SOLR
40.0000 mg | Freq: Every day | INTRAVENOUS | Status: DC
  Administered 2017-10-28: 40 mg via INTRAVENOUS
  Filled 2017-10-28: qty 40

## 2017-10-28 MED ORDER — BUPIVACAINE LIPOSOME 1.3 % IJ SUSP
20.0000 mL | INTRAMUSCULAR | Status: AC
  Administered 2017-10-28: 20 mL
  Filled 2017-10-28: qty 20

## 2017-10-28 MED ORDER — HYDROCORTISONE NA SUCCINATE PF 100 MG IJ SOLR
100.0000 mg | INTRAMUSCULAR | Status: AC
  Administered 2017-10-28: 100 mg via INTRAVENOUS
  Filled 2017-10-28: qty 2

## 2017-10-28 MED ORDER — HYDROCHLOROTHIAZIDE 12.5 MG PO CAPS
12.5000 mg | ORAL_CAPSULE | Freq: Every day | ORAL | Status: DC
  Administered 2017-10-29: 12.5 mg via ORAL
  Filled 2017-10-28: qty 1

## 2017-10-28 MED ORDER — POVIDONE-IODINE 7.5 % EX SOLN
Freq: Once | CUTANEOUS | Status: DC
  Filled 2017-10-28: qty 118

## 2017-10-28 MED ORDER — HYDROMORPHONE HCL 1 MG/ML IJ SOLN
0.2500 mg | INTRAMUSCULAR | Status: DC | PRN
  Administered 2017-10-28: 0.5 mg via INTRAVENOUS

## 2017-10-28 MED ORDER — ONDANSETRON HCL 4 MG PO TABS: 4.0000 mg | ORAL_TABLET | Freq: Four times a day (QID) | ORAL | Status: DC | PRN

## 2017-10-28 MED ORDER — ONDANSETRON HCL 4 MG/2ML IJ SOLN
INTRAMUSCULAR | Status: AC
  Filled 2017-10-28: qty 2

## 2017-10-28 MED ORDER — SUGAMMADEX SODIUM 200 MG/2ML IV SOLN
INTRAVENOUS | Status: DC | PRN
  Administered 2017-10-28: 150 mg via INTRAVENOUS

## 2017-10-28 MED ORDER — ALBUMIN HUMAN 5 % IV SOLN
12.5000 g | Freq: Once | INTRAVENOUS | Status: AC
  Administered 2017-10-28: 12.5 g via INTRAVENOUS

## 2017-10-28 MED ORDER — SUCCINYLCHOLINE CHLORIDE 20 MG/ML IJ SOLN
INTRAMUSCULAR | Status: DC | PRN
  Administered 2017-10-28: 120 mg via INTRAVENOUS

## 2017-10-28 MED ORDER — MIDAZOLAM HCL 5 MG/5ML IJ SOLN
INTRAMUSCULAR | Status: DC | PRN
  Administered 2017-10-28: 2 mg via INTRAVENOUS

## 2017-10-28 MED ORDER — THROMBIN 5000 UNITS EX SOLR
CUTANEOUS | Status: DC | PRN
  Administered 2017-10-28: 5000 [IU] via TOPICAL

## 2017-10-28 MED ORDER — HYDROMORPHONE HCL 2 MG PO TABS
1.0000 mg | ORAL_TABLET | ORAL | Status: DC | PRN
  Administered 2017-10-28 – 2017-10-29 (×4): 2 mg via ORAL
  Filled 2017-10-28 (×4): qty 1

## 2017-10-28 MED ORDER — MENTHOL 3 MG MT LOZG: 1.0000 | LOZENGE | OROMUCOSAL | Status: DC | PRN

## 2017-10-28 MED ORDER — LIDOCAINE 2% (20 MG/ML) 5 ML SYRINGE
INTRAMUSCULAR | Status: AC
  Filled 2017-10-28: qty 5

## 2017-10-28 MED ORDER — POTASSIUM CHLORIDE IN NACL 20-0.9 MEQ/L-% IV SOLN: INTRAVENOUS | Status: DC

## 2017-10-28 MED ORDER — PROPOFOL 10 MG/ML IV BOLUS
INTRAVENOUS | Status: DC | PRN
  Administered 2017-10-28: 130 mg via INTRAVENOUS

## 2017-10-28 MED ORDER — BUPIVACAINE-EPINEPHRINE 0.25% -1:200000 IJ SOLN
INTRAMUSCULAR | Status: DC | PRN
  Administered 2017-10-28: 20 mL

## 2017-10-28 MED ORDER — ACETAMINOPHEN 325 MG PO TABS: 650.0000 mg | ORAL_TABLET | ORAL | Status: DC | PRN

## 2017-10-28 MED ORDER — FENTANYL CITRATE (PF) 250 MCG/5ML IJ SOLN
INTRAMUSCULAR | Status: AC
  Filled 2017-10-28: qty 5

## 2017-10-28 MED ORDER — FENTANYL CITRATE (PF) 100 MCG/2ML IJ SOLN
INTRAMUSCULAR | Status: DC | PRN
  Administered 2017-10-28 (×7): 50 ug via INTRAVENOUS
  Administered 2017-10-28: 150 ug via INTRAVENOUS

## 2017-10-28 MED ORDER — FLEET ENEMA 7-19 GM/118ML RE ENEM: 1.0000 | ENEMA | Freq: Once | RECTAL | Status: DC | PRN

## 2017-10-28 MED ORDER — METHOCARBAMOL 500 MG PO TABS
500.0000 mg | ORAL_TABLET | Freq: Four times a day (QID) | ORAL | Status: DC | PRN
  Administered 2017-10-28 – 2017-10-29 (×2): 500 mg via ORAL
  Filled 2017-10-28: qty 1

## 2017-10-28 MED ORDER — SUGAMMADEX SODIUM 200 MG/2ML IV SOLN
INTRAVENOUS | Status: AC
  Filled 2017-10-28: qty 2

## 2017-10-28 MED ORDER — ONDANSETRON HCL 4 MG/2ML IJ SOLN
4.0000 mg | Freq: Four times a day (QID) | INTRAMUSCULAR | Status: DC | PRN
Start: 2017-10-28 — End: 2017-10-29
  Administered 2017-10-28 – 2017-10-29 (×2): 4 mg via INTRAVENOUS
  Filled 2017-10-28 (×2): qty 2

## 2017-10-28 MED ORDER — SODIUM CHLORIDE 0.9% FLUSH
3.0000 mL | Freq: Two times a day (BID) | INTRAVENOUS | Status: DC
  Administered 2017-10-28: 3 mL via INTRAVENOUS

## 2017-10-28 MED ORDER — MORPHINE SULFATE (PF) 4 MG/ML IV SOLN: 1.0000 mg | INTRAVENOUS | Status: DC | PRN

## 2017-10-28 MED ORDER — CARISOPRODOL 350 MG PO TABS
350.0000 mg | ORAL_TABLET | Freq: Three times a day (TID) | ORAL | Status: DC
  Administered 2017-10-29: 350 mg via ORAL
  Filled 2017-10-28 (×2): qty 1

## 2017-10-28 MED ORDER — CEFAZOLIN SODIUM-DEXTROSE 2-4 GM/100ML-% IV SOLN
2.0000 g | INTRAVENOUS | Status: AC
  Administered 2017-10-28: 2 g via INTRAVENOUS

## 2017-10-28 MED ORDER — DOCUSATE SODIUM 100 MG PO CAPS
100.0000 mg | ORAL_CAPSULE | Freq: Two times a day (BID) | ORAL | Status: DC
  Filled 2017-10-28 (×2): qty 1

## 2017-10-28 MED ORDER — SURGIFOAM 100 EX MISC
CUTANEOUS | Status: DC | PRN
  Administered 2017-10-28: 20 mL via TOPICAL

## 2017-10-28 MED ORDER — CALCIUM CITRATE-VITAMIN D 500-333 MG-UNIT PO CHEW: CHEWABLE_TABLET | ORAL | Status: DC

## 2017-10-28 MED ORDER — THROMBIN (RECOMBINANT) 5000 UNITS EX SOLR
CUTANEOUS | Status: AC
  Filled 2017-10-28: qty 5000

## 2017-10-28 MED ORDER — CEFAZOLIN SODIUM-DEXTROSE 2-4 GM/100ML-% IV SOLN
2.0000 g | Freq: Three times a day (TID) | INTRAVENOUS | Status: AC
  Administered 2017-10-28 – 2017-10-29 (×2): 2 g via INTRAVENOUS
  Filled 2017-10-28 (×2): qty 100

## 2017-10-28 MED ORDER — LIDOCAINE HCL (CARDIAC) 20 MG/ML IV SOLN
INTRAVENOUS | Status: DC | PRN
  Administered 2017-10-28: 100 mg via INTRAVENOUS

## 2017-10-28 MED ORDER — HEMOSTATIC AGENTS (NO CHARGE) OPTIME
TOPICAL | Status: DC | PRN
  Administered 2017-10-28: 1 via TOPICAL

## 2017-10-28 MED ORDER — LEFLUNOMIDE 20 MG PO TABS
20.0000 mg | ORAL_TABLET | Freq: Every day | ORAL | Status: DC
  Administered 2017-10-28: 20 mg via ORAL
  Filled 2017-10-28: qty 1

## 2017-10-28 MED ORDER — PROPOFOL 10 MG/ML IV BOLUS
INTRAVENOUS | Status: AC
  Filled 2017-10-28: qty 20

## 2017-10-28 MED ORDER — BUPROPION HCL 100 MG PO TABS
100.0000 mg | ORAL_TABLET | Freq: Two times a day (BID) | ORAL | Status: DC
  Administered 2017-10-28 – 2017-10-29 (×2): 100 mg via ORAL
  Filled 2017-10-28 (×2): qty 1

## 2017-10-28 MED ORDER — 0.9 % SODIUM CHLORIDE (POUR BTL) OPTIME
TOPICAL | Status: DC | PRN
  Administered 2017-10-28: 1000 mL

## 2017-10-28 MED ORDER — HYDROMORPHONE HCL 1 MG/ML IJ SOLN: 0.2500 mg | INTRAMUSCULAR | Status: DC | PRN

## 2017-10-28 MED ORDER — BUPIVACAINE-EPINEPHRINE (PF) 0.25% -1:200000 IJ SOLN
INTRAMUSCULAR | Status: AC
  Filled 2017-10-28: qty 30

## 2017-10-28 MED ORDER — SODIUM CHLORIDE 0.9% FLUSH: 3.0000 mL | INTRAVENOUS | Status: DC | PRN

## 2017-10-28 MED ORDER — METHOCARBAMOL 1000 MG/10ML IJ SOLN: 500.0000 mg | Freq: Four times a day (QID) | INTRAVENOUS | Status: DC | PRN

## 2017-10-28 MED ORDER — ALBUMIN HUMAN 5 % IV SOLN
INTRAVENOUS | Status: AC
  Filled 2017-10-28: qty 250

## 2017-10-28 MED ORDER — DEXAMETHASONE SODIUM PHOSPHATE 10 MG/ML IJ SOLN
INTRAMUSCULAR | Status: AC
  Filled 2017-10-28: qty 1

## 2017-10-28 MED ORDER — LORATADINE 10 MG PO TABS
10.0000 mg | ORAL_TABLET | Freq: Every day | ORAL | Status: DC
  Administered 2017-10-29: 10 mg via ORAL
  Filled 2017-10-28: qty 1

## 2017-10-28 MED ORDER — LACTATED RINGERS IV SOLN
INTRAVENOUS | Status: DC
  Administered 2017-10-28 (×3): via INTRAVENOUS

## 2017-10-28 SURGICAL SUPPLY — 88 items
BENZOIN TINCTURE PRP APPL 2/3 (GAUZE/BANDAGES/DRESSINGS) ×3 IMPLANT
BIT DRILL 3.2 (BIT) ×2
BIT DRILL 65X3.2XQC STP NS (BIT) ×1 IMPLANT
BIT DRL 65X3.2XQC STP NS (BIT) ×1
BLADE CLIPPER SURG (BLADE) IMPLANT
BONE VIVIGEN FORMABLE 10CC (Bone Implant) ×3 IMPLANT
BUR PRESCISION 1.7 ELITE (BURR) ×3 IMPLANT
BUR ROUND PRECISION 4.0 (BURR) IMPLANT
BUR ROUND PRECISION 4.0MM (BURR)
BUR SABER RD CUTTING 3.0 (BURR) ×2 IMPLANT
BUR SABER RD CUTTING 3.0MM (BURR) ×1
CAGE BULLET CONCORDE 9X10X27 (Cage) ×2 IMPLANT
CAGE BULLET CONCORDE 9X10X27MM (Cage) ×1 IMPLANT
CARTRIDGE OIL MAESTRO DRILL (MISCELLANEOUS) ×1 IMPLANT
CLOSURE STERI-STRIP 1/2X4 (GAUZE/BANDAGES/DRESSINGS) ×1
CLOSURE WOUND 1/2 X4 (GAUZE/BANDAGES/DRESSINGS) ×2
CLSR STERI-STRIP ANTIMIC 1/2X4 (GAUZE/BANDAGES/DRESSINGS) ×2 IMPLANT
CONT SPEC 4OZ CLIKSEAL STRL BL (MISCELLANEOUS) ×3 IMPLANT
COVER MAYO STAND STRL (DRAPES) ×6 IMPLANT
COVER SURGICAL LIGHT HANDLE (MISCELLANEOUS) ×3 IMPLANT
DIFFUSER DRILL AIR PNEUMATIC (MISCELLANEOUS) ×3 IMPLANT
DRAIN CHANNEL 15F RND FF W/TCR (WOUND CARE) IMPLANT
DRAPE C-ARM 42X72 X-RAY (DRAPES) ×3 IMPLANT
DRAPE C-ARMOR (DRAPES) IMPLANT
DRAPE POUCH INSTRU U-SHP 10X18 (DRAPES) ×3 IMPLANT
DRAPE SURG 17X23 STRL (DRAPES) ×12 IMPLANT
DURAPREP 26ML APPLICATOR (WOUND CARE) ×3 IMPLANT
ELECT BLADE 4.0 EZ CLEAN MEGAD (MISCELLANEOUS) ×3
ELECT CAUTERY BLADE 6.4 (BLADE) ×3 IMPLANT
ELECT REM PT RETURN 9FT ADLT (ELECTROSURGICAL) ×3
ELECTRODE BLDE 4.0 EZ CLN MEGD (MISCELLANEOUS) ×1 IMPLANT
ELECTRODE REM PT RTRN 9FT ADLT (ELECTROSURGICAL) ×1 IMPLANT
EVACUATOR SILICONE 100CC (DRAIN) IMPLANT
GAUZE SPONGE 4X4 12PLY STRL (GAUZE/BANDAGES/DRESSINGS) ×3 IMPLANT
GAUZE SPONGE 4X4 16PLY XRAY LF (GAUZE/BANDAGES/DRESSINGS) ×3 IMPLANT
GLOVE BIO SURGEON STRL SZ7 (GLOVE) ×3 IMPLANT
GLOVE BIO SURGEON STRL SZ8 (GLOVE) ×3 IMPLANT
GLOVE BIOGEL PI IND STRL 7.0 (GLOVE) ×1 IMPLANT
GLOVE BIOGEL PI IND STRL 8 (GLOVE) ×1 IMPLANT
GLOVE BIOGEL PI INDICATOR 7.0 (GLOVE) ×2
GLOVE BIOGEL PI INDICATOR 8 (GLOVE) ×2
GOWN STRL REUS W/ TWL LRG LVL3 (GOWN DISPOSABLE) ×2 IMPLANT
GOWN STRL REUS W/ TWL XL LVL3 (GOWN DISPOSABLE) ×1 IMPLANT
GOWN STRL REUS W/TWL LRG LVL3 (GOWN DISPOSABLE) ×4
GOWN STRL REUS W/TWL XL LVL3 (GOWN DISPOSABLE) ×2
IV CATH 14GX2 1/4 (CATHETERS) ×3 IMPLANT
KIT BASIN OR (CUSTOM PROCEDURE TRAY) ×3 IMPLANT
KIT POSITION SURG JACKSON T1 (MISCELLANEOUS) ×3 IMPLANT
KIT ROOM TURNOVER OR (KITS) ×3 IMPLANT
MARKER SKIN DUAL TIP RULER LAB (MISCELLANEOUS) ×3 IMPLANT
NDL SAFETY ECLIPSE 18X1.5 (NEEDLE) ×1 IMPLANT
NEEDLE 22X1 1/2 (OR ONLY) (NEEDLE) ×6 IMPLANT
NEEDLE HYPO 18GX1.5 SHARP (NEEDLE) ×2
NEEDLE HYPO 25GX1X1/2 BEV (NEEDLE) ×3 IMPLANT
NEEDLE SPNL 18GX3.5 QUINCKE PK (NEEDLE) ×6 IMPLANT
NS IRRIG 1000ML POUR BTL (IV SOLUTION) ×3 IMPLANT
OIL CARTRIDGE MAESTRO DRILL (MISCELLANEOUS) ×3
PACK LAMINECTOMY ORTHO (CUSTOM PROCEDURE TRAY) ×3 IMPLANT
PACK UNIVERSAL I (CUSTOM PROCEDURE TRAY) ×3 IMPLANT
PAD ARMBOARD 7.5X6 YLW CONV (MISCELLANEOUS) ×6 IMPLANT
PATTIES SURGICAL .5 X1 (DISPOSABLE) ×3 IMPLANT
PATTIES SURGICAL .5X1.5 (GAUZE/BANDAGES/DRESSINGS) ×3 IMPLANT
PROBE NEUROSIGN BIPOLAR (INSTRUMENTS) ×3 IMPLANT
ROD PRE BENT EXP 40MM (Rod) ×6 IMPLANT
SCREW SET SINGLE INNER (Screw) ×12 IMPLANT
SCREW VIPER CORT FIX 5.00X30 (Screw) ×6 IMPLANT
SCREW VIPER CORT FIX 6.00X30 (Screw) ×3 IMPLANT
SCREW VIPER CORT FIX 6X35 (Screw) ×3 IMPLANT
SPOGE SURGIFLO 8M (HEMOSTASIS) ×2
SPONGE INTESTINAL PEANUT (DISPOSABLE) ×3 IMPLANT
SPONGE SURGIFLO 8M (HEMOSTASIS) ×1 IMPLANT
SPONGE SURGIFOAM ABS GEL 100 (HEMOSTASIS) ×3 IMPLANT
STRIP CLOSURE SKIN 1/2X4 (GAUZE/BANDAGES/DRESSINGS) ×4 IMPLANT
SURGIFLO W/THROMBIN 8M KIT (HEMOSTASIS) IMPLANT
SUT MNCRL AB 4-0 PS2 18 (SUTURE) ×3 IMPLANT
SUT VIC AB 0 CT1 18XCR BRD 8 (SUTURE) ×1 IMPLANT
SUT VIC AB 0 CT1 8-18 (SUTURE) ×2
SUT VIC AB 1 CT1 18XCR BRD 8 (SUTURE) ×1 IMPLANT
SUT VIC AB 1 CT1 8-18 (SUTURE) ×2
SUT VIC AB 2-0 CT2 18 VCP726D (SUTURE) ×6 IMPLANT
SYR 20CC LL (SYRINGE) ×6 IMPLANT
SYR BULB IRRIGATION 50ML (SYRINGE) ×3 IMPLANT
SYR CONTROL 10ML LL (SYRINGE) ×6 IMPLANT
SYR TB 1ML LUER SLIP (SYRINGE) ×3 IMPLANT
TAPE CLOTH SURG 4X10 WHT LF (GAUZE/BANDAGES/DRESSINGS) ×3 IMPLANT
TRAY FOLEY W/METER SILVER 16FR (SET/KITS/TRAYS/PACK) ×3 IMPLANT
WATER STERILE IRR 1000ML POUR (IV SOLUTION) ×3 IMPLANT
YANKAUER SUCT BULB TIP NO VENT (SUCTIONS) ×3 IMPLANT

## 2017-10-28 NOTE — H&P (Signed)
PREOPERATIVE H&P  Chief Complaint: Bilateral leg pain  HPI: Latoya Smith is a 51 y.o. female who presents with ongoing pain in the bilateral legs. 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 Anemia    hx  . Arthritis    rheumatoid  . Complication of anesthesia    "crying when have vailum"  . Depression   . Family history of adverse reaction to anesthesia    valium relaxed too much "stopped breathing in recovery"  . GERD (gastroesophageal reflux disease)   . Hypertension    Past Surgical History:  Procedure Laterality Date  . CARPAL TUNNEL RELEASE Bilateral 1998  . CHOLECYSTECTOMY  05/21/1995  . GASTRIC BYPASS  2008  . LUMBAR LAMINECTOMY Right 07/01/2017   Procedure: RIGHT SIDED LUMBAR 4-5 MICRODISECTOMY.;  Surgeon: Estill Bamberg, MD;  Location: [facility] OR;  Service: Orthopedics;  Laterality: Right;  RIGHT SIDED LUMBAR 4-5 MICRODISECTOMY.  TIME REQUESTED 2 HRS.   Marland Kitchen NASAL SINUS SURGERY  2004  . TONSILLECTOMY    . WISDOM TOOTH EXTRACTION  1988   Social History   Socioeconomic History  . Marital status: Married    Spouse name: Not on file  . Number of children: Not on file  . Years of education: Not on file  . Highest education level: Not on file  Social Needs  . Financial resource strain: Not on file  . Food insecurity - worry: Not on file  . Food insecurity - inability: Not on file  . Transportation needs - medical: Not on file  . Transportation needs - non-medical: Not on file  Occupational History  . Not on file  Tobacco Use  . Smoking status: Never Smoker  . Smokeless tobacco: Never Used  Substance and Sexual Activity    . Alcohol use: Yes    Comment: occ  . Drug use: No  . Sexual activity: Not on file  Other Topics Concern  . Not on file  Social History Narrative  . Not on file   No family history on file. Allergies  Allergen Reactions  . Percocet [Oxycodone-Acetaminophen] Other (See Comments)    migraines  . Valium [Diazepam] Other (See Comments)    Cries uncontrollably    Prior to Admission medications   Medication Sig Start Date End Date Taking? Authorizing Provider  acetaminophen (TYLENOL) 500 MG tablet Take 1,000 mg every 6 (six) hours as needed by mouth for moderate pain or headache.   Yes [provider]  acyclovir (ZOVIRAX) 400 MG tablet Take 400 mg by mouth every other day.   Yes [provider]  buPROPion (WELLBUTRIN) 100 MG tablet Take 100 mg by mouth 2 (two) times daily.   Yes [provider]  celecoxib (CELEBREX) 200 MG capsule Take 200 mg 2 (two) times daily by mouth.   Yes [provider]  cyclobenzaprine (FLEXERIL) 10 MG tablet Take 10 mg at bedtime as needed by mouth for muscle spasms.   Yes [provider]  cyclobenzaprine (FLEXERIL) 5 MG tablet Take 5 mg daily as needed by mouth for muscle spasms.    Yes [provider]  diphenhydrAMINE (BENADRYL) 25 mg  capsule Take 50 mg by mouth at bedtime.   Yes [provider]  hydrochlorothiazide (MICROZIDE) 12.5 MG capsule Take 12.5 mg by mouth daily.   Yes [provider]  HYDROcodone-acetaminophen (NORCO/VICODIN) 5-325 MG tablet Take 1 tablet by mouth every 4 hours as needed for pain 06/24/17  Yes [provider]  leflunomide (ARAVA) 20 MG tablet Take 20 mg by mouth at bedtime.   Yes [provider]  levonorgestrel (MIRENA) 20 MCG/24HR IUD 1 each by Intrauterine route once.   Yes [provider]  loratadine (CLARITIN) 10 MG tablet Take 10 mg by mouth daily.   Yes [provider]  losartan (COZAAR) 50 MG tablet Take 25 mg daily by  mouth.    Yes [provider]  neomycin-bacitracin-polymyxin (NEOSPORIN) ointment Apply 1 application as needed topically for wound care. apply to eye   Yes [provider]  omeprazole (PRILOSEC) 20 MG capsule Take 20 mg by mouth daily.   Yes [provider]  predniSONE (DELTASONE) 5 MG tablet Take 5 mg daily with breakfast by mouth. Cut to 2.5mg  for surgery   Yes [provider]  Tocilizumab (ACTEMRA IV) Inject 8 mg/kg every 28 (twenty-eight) days into the vein.    Yes [provider]  traMADol (ULTRAM) 50 MG tablet Take 50-100 mg every 6 (six) hours as needed by mouth for moderate pain.   Yes [provider]  zolpidem (AMBIEN) 10 MG tablet Take 10 mg by mouth at bedtime.   Yes [provider]  Calcium Citrate-Vitamin D (CELEBRATE CALCIUM PLUS 500 PO) Take 1-2 tablets by mouth See admin instructions. Takes 1 tablet in morning and 2 tablet in the evening    [provider]  etodolac (LODINE) 400 MG tablet Take 1 tablet (400 mg total) by mouth 2 (two) times daily. Patient not taking: Reported on 10/20/2017 07/09/17   Graciella Freer A, PA-C  ferrous sulfate 325 (65 FE) MG tablet Take 325 mg by mouth daily with breakfast. Not sure of mg    [provider]  IRON PO Take 1 tablet daily by mouth.    [provider]     All other systems have been reviewed and were otherwise negative with the exception of those mentioned in the HPI and as above.  Physical Exam: There were no vitals filed for this visit.  General: Alert, no acute distress Cardiovascular: No pedal edema Respiratory: No cyanosis, no use of accessory musculature Skin: No lesions in the area of chief complaint Neurologic: Sensation intact distally Psychiatric: Patient is competent for consent with normal mood and affect Lymphatic: No axillary or cervical lymphadenopathy  MUSCULOSKELETAL: + SLR on the left  Assessment/Plan: Left greater than  right leg pain Plan for Procedure(s): LEFT SIDED LUMBAR 4-5 TRANSFORAMINAL LUMBAR INTERBODY FUSION WITH INSTRUMENTATION AND ALLOGRAFT   Emilee Hero, MD 10/28/2017 7:14 AM

## 2017-10-28 NOTE — Progress Notes (Signed)
Orthopedic Tech Progress Note Patient Details:  Latoya Smith [birth year] [id number] Patient has brace. Patient ID: Latoya Smith, female   DOB: [birth year], 51 y.o.   MRN: [id number]   [name] 10/28/2017, 7:31 PM

## 2017-10-28 NOTE — Anesthesia Procedure Notes (Signed)
Procedure Name: Intubation Date/Time: 10/28/2017 11:47 AM Performed by: Lovie Chol, CRNA Pre-anesthesia Checklist: Patient identified, Emergency Drugs available, Suction available and Patient being monitored Patient Re-evaluated:Patient Re-evaluated prior to induction Oxygen Delivery Method: Circle System Utilized Preoxygenation: Pre-oxygenation with 100% oxygen Induction Type: IV induction Ventilation: Mask ventilation without difficulty Laryngoscope Size: Miller and 2 Grade View: Grade I Tube type: Oral Tube size: 7.0 mm Number of attempts: 1 Airway Equipment and Method: Stylet and Oral airway Placement Confirmation: ETT inserted through vocal cords under direct vision,  positive ETCO2 and breath sounds checked- equal and bilateral Secured at: 22 cm Tube secured with: Tape Dental Injury: Teeth and Oropharynx as per pre-operative assessment

## 2017-10-28 NOTE — Transfer of Care (Signed)
Immediate Anesthesia Transfer of Care Note  Patient: Latoya Smith  Procedure(s) Performed: LEFT SIDED LUMBAR 4-5 TRANSFORAMINAL LUMBAR INTERBODY FUSION WITH INSTRUMENTATION AND ALLOGRAFT; REQUEST 4.5 HOURS AND FLIP AVAILABLE (Left Spine Lumbar)  Patient Location: PACU  Anesthesia Type:General  Level of Consciousness: awake, oriented and patient cooperative  Airway & Oxygen Therapy: Patient Spontanous Breathing and Patient connected to nasal cannula oxygen  Post-op Assessment: Report given to RN and Post -op Vital signs reviewed and stable  Post vital signs: Reviewed  Last Vitals:  Vitals:   10/28/17 0849 10/28/17 1550  BP: 138/83 (!) 84/55  Pulse: 86 94  Resp: 20 (!) 21  Temp: 36.9 C (!) 36.3 C  SpO2: 98% 100%    Last Pain:  Vitals:   10/28/17 1550  TempSrc:   PainSc: Asleep         Complications: No apparent anesthesia complications

## 2017-10-28 NOTE — Anesthesia Preprocedure Evaluation (Addendum)
Anesthesia Evaluation  Patient identified by MRN, date of birth, ID band Patient awake    Reviewed: Allergy & Precautions, NPO status , Patient's Chart, lab work & pertinent test results  Airway Mallampati: I  TM Distance: >3 FB Neck ROM: Full    Dental  (+) Teeth Intact, Dental Advisory Given   Pulmonary neg pulmonary ROS,    Pulmonary exam normal breath sounds clear to auscultation       Cardiovascular hypertension, Pt. on medications Normal cardiovascular exam Rhythm:Regular Rate:Normal     Neuro/Psych  Headaches, Depression    GI/Hepatic Neg liver ROS, GERD  Medicated and Controlled,  Endo/Other    Renal/GU negative Renal ROS     Musculoskeletal   Abdominal   Peds  Hematology   Anesthesia Other Findings   Reproductive/Obstetrics                            Anesthesia Physical Anesthesia Plan  ASA: II  Anesthesia Plan: General   Post-op Pain Management:    Induction: Intravenous  PONV Risk Score and Plan: 3 and Ondansetron, Dexamethasone and Midazolam  Airway Management Planned: Oral ETT  Additional Equipment:   Intra-op Plan:   Post-operative Plan: Extubation in OR  Informed Consent: I have reviewed the patients History and Physical, chart, labs and discussed the procedure including the risks, benefits and alternatives for the proposed anesthesia with the patient or authorized representative who has indicated his/her understanding and acceptance.     Plan Discussed with: CRNA and Surgeon  Anesthesia Plan Comments:         Anesthesia Quick Evaluation

## 2017-10-28 NOTE — Anesthesia Postprocedure Evaluation (Signed)
Anesthesia Post Note  Patient: Latoya Smith  Procedure(s) Performed: LEFT SIDED LUMBAR 4-5 TRANSFORAMINAL LUMBAR INTERBODY FUSION WITH INSTRUMENTATION AND ALLOGRAFT; REQUEST 4.5 HOURS AND FLIP AVAILABLE (Left Spine Lumbar)     Patient location during evaluation: PACU Anesthesia Type: General Level of consciousness: awake and alert, patient cooperative and oriented Pain management: pain level controlled Vital Signs Assessment: post-procedure vital signs reviewed and stable Respiratory status: spontaneous breathing, nonlabored ventilation, respiratory function stable and patient connected to nasal cannula oxygen Cardiovascular status: blood pressure returned to baseline and stable Postop Assessment: no apparent nausea or vomiting Anesthetic complications: no    Last Vitals:  Vitals:   10/28/17 1705 10/28/17 1708  BP: 120/68 120/68  Pulse: 98 98  Resp: 12 12  Temp:  (!) 36.4 C  SpO2: 98% 99%    Last Pain:  Vitals:   10/28/17 1644  TempSrc:   PainSc: 3     LLE Motor Response: Purposeful movement;Responds to commands (10/28/17 1708) LLE Sensation: Full sensation (10/28/17 1708) RLE Motor Response: Purposeful movement;Responds to commands (10/28/17 1708) RLE Sensation: Full sensation (10/28/17 1708)      Estela Vinal,E. Kenlee Maler

## 2017-10-29 ENCOUNTER — Other Ambulatory Visit: Payer: Self-pay

## 2017-10-29 MED ORDER — METHOCARBAMOL 500 MG PO TABS: 500.0000 mg | ORAL_TABLET | Freq: Four times a day (QID) | ORAL | 1 refills | Status: AC | PRN

## 2017-10-29 NOTE — Progress Notes (Signed)
    Patient doing excellent PO day 1 S/P revision decompression and fusion. Reports resolved pre-op leg pain and expected PO LBP and stiffness. She has been up and walking, tolerating TLSO brace well. She has been eating, denies N/V/D, yes passing gas.  Physical Exam: BP 117/74 (BP Location: Right Arm)   Pulse 91   Temp 98.3 F (36.8 C) (Oral)   Resp 18   SpO2 100%   Dressing in place, CDI, pt sitting up comfortably in chair TLSO brace applied, distal compartments soft, appears comfortable  NVI  POD #1 s/p revision decompression and fusion doing excellent   - up with PT/OT, encourage ambulation  -D/C after PT - Dilaudid for pain and Robaxin for muscle spasms - likely d/c home today after PT

## 2017-10-29 NOTE — Evaluation (Signed)
Occupational Therapy Evaluation and Discharge  Patient Details Name: Latoya Smith MRN: [id number] DOB: [birth year] Today's Date: 10/29/2017    History of Present Illness Pti s a 51 y.o. female s/p L4-5 TLIF. PMHx: RA, Depression, GERD, HTN, Bil carpal tunnel release, L4-5 microdisectomy 06/2017.   Clinical Impression   Pt reports she was independent with ADL PTA. Currently pt min guard with ADL and functional mobility with the exception of min assist for LB ADL. All back, safety, and ADL education completed with pt. Pt planning to d/c home with 24/7 supervision from family initially. No further acute OT needs identified; signing off at this time. Please re-consult if needs change. Thank you for this referral.     Follow Up Recommendations  No OT follow up;Supervision/Assistance - 24 hour(initially)    Equipment Recommendations  None recommended by OT    Recommendations for Other Services       Precautions / Restrictions Precautions Precautions: Back Precaution Booklet Issued: No Precaution Comments: Educated pt on 3/3 back precautions Required Braces or Orthoses: Spinal Brace Spinal Brace: Thoracolumbosacral orthotic;Applied in sitting position Restrictions Weight Bearing Restrictions: No      Mobility Bed Mobility               General bed mobility comments: Pt OOB in chair upon arrival  Transfers Overall transfer level: Needs assistance Equipment used: None Transfers: Sit to/from Stand Sit to Stand: Min guard         General transfer comment: increased time and effort. cues for hand placement and technique    Balance Overall balance assessment: Needs assistance Sitting-balance support: Feet supported;No upper extremity supported Sitting balance-Leahy Scale: Good     Standing balance support: No upper extremity supported;During functional activity Standing balance-Leahy Scale: Fair Standing balance comment: static standing                            ADL either performed or assessed with clinical judgement   ADL Overall ADL's : Needs assistance/impaired Eating/Feeding: Set up;Sitting   Grooming: Supervision/safety;Standing Grooming Details (indicate cue type and reason): Educated on use of 2 cups for oral care Upper Body Bathing: Set up;Supervision/ safety;Sitting   Lower Body Bathing: Min guard;Sit to/from stand   Upper Body Dressing : Set up;Supervision/safety;Standing Upper Body Dressing Details (indicate cue type and reason): for shirt and brace Lower Body Dressing: Minimal assistance;Sit to/from stand Lower Body Dressing Details (indicate cue type and reason): Husband to assist as needed Toilet Transfer: Min guard;Ambulation;BSC;RW Toilet Transfer Details (indicate cue type and reason): Simulated by sit to stand from chair with functional mobiltiy    Toileting - Clothing Manipulation Details (indicate cue type and reason): Educated on proper technique for peri care without twsiting Tub/ Shower Transfer: Min guard;Walk-in shower;Ambulation;3 in 1;Rolling walker Tub/Shower Transfer Details (indicate cue type and reason): Pt able to perform walk in shower transfer with RW with min guard assist-husband to supervise upon return home. Educated on use of 3 in 1 in shower as a seat Functional mobility during ADLs: Min guard;Rolling walker General ADL Comments: Educated pt and husband on maintaining back precuations during functional activities, frequent mobility thorughout the day upon return home.     Vision         Perception     Praxis      Pertinent Vitals/Pain Pain Assessment: Faces Faces Pain Scale: Hurts even more Pain Location: back Pain Descriptors / Indicators: Aching;Grimacing Pain Intervention(s): Monitored during session;Repositioned  Hand Dominance     Extremity/Trunk Assessment Upper Extremity Assessment Upper Extremity Assessment: Overall WFL for tasks assessed   Lower Extremity  Assessment Lower Extremity Assessment: Defer to PT evaluation   Cervical / Trunk Assessment Cervical / Trunk Assessment: Other exceptions Cervical / Trunk Exceptions: s/p spinal sx   Communication Communication Communication: No difficulties   Cognition Arousal/Alertness: Awake/alert Behavior During Therapy: WFL for tasks assessed/performed Overall Cognitive Status: Within Functional Limits for tasks assessed                                     General Comments       Exercises     Shoulder Instructions      Home Living Family/patient expects to be discharged to:: Private residence Living Arrangements: Spouse/significant other Available Help at Discharge: Family;Available 24 hours/day Type of Home: House Home Access: Stairs to enter [employer] of Steps: 2   Home Layout: One level     Bathroom Shower/Tub: Producer, television/film/video: Handicapped height     Home Equipment: Environmental consultant - 2 wheels;Bedside commode          Prior Functioning/Environment Level of Independence: Independent                 OT Problem List:        OT Treatment/Interventions:      OT Goals(Current goals can be found in the care plan section) Acute Rehab OT Goals Patient Stated Goal: return home OT Goal Formulation: All assessment and education complete, DC therapy  OT Frequency:     Barriers to D/C:            Co-evaluation              AM-PAC PT "6 Clicks" Daily Activity     Outcome Measure Help from another person eating meals?: None Help from another person taking care of personal grooming?: A Little Help from another person toileting, which includes using toliet, bedpan, or urinal?: A Little Help from another person bathing (including washing, rinsing, drying)?: A Little Help from another person to put on and taking off regular upper body clothing?: A Little Help from another person to put on and taking off regular lower body  clothing?: A Little 6 Click Score: 19   End of Session Equipment Utilized During Treatment: Rolling walker;Back brace Nurse Communication: Mobility status;Other (comment)(no equipment or f/u needs)  Activity Tolerance: Patient tolerated treatment well Patient left: Other (comment)(in hallway with PT)  OT Visit Diagnosis: Other abnormalities of gait and mobility (R26.89);Pain Pain - part of body: (back)                Time: 9628-3662 OT Time Calculation (min): 25 min Charges:  OT General Charges $OT Visit: 1 Visit OT Evaluation $OT Eval Moderate Complexity: 1 Mod OT Treatments $Self Care/Home Management : 8-22 mins G-Codes:     Erskin Zinda A. Brett Albino, M.S., OTR/L Pager: [phone]  Gaye Alken 10/29/2017, 9:34 AM

## 2017-10-29 NOTE — Op Note (Signed)
NAME:  Latoya Smith, Latoya Smith NO.:  MEDICAL RECORD NO.:  [id number]  PHYSICIAN:  Estill Bamberg, MD      DATE OF BIRTH:  [birth year]  DATE OF PROCEDURE:  10/28/2017                              OPERATIVE REPORT   PREOPERATIVE DIAGNOSES: 1. Left greater than right lumbar radiculopathy. 2. 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  POSTOPERATIVE DIAGNOSES: 1. Left greater than right lumbar radiculopathy. 2. 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  PROCEDURE: 1. Revision decompression, L4-5. 2. Left-sided L4-5 transforaminal lumbar interbody fusion. 3. Right-sided L4-5 posterolateral fusion. 4. Insertion of interbody device x1 (10 x 27 mm Concorde     intervertebral spacer). 5. Placement of posterior instrumentation L4, L5. 6. Use of local autograft. 7. Use of morselized allograft - ViviGen. 8. Intraoperative use of fluoroscopy.  SURGEON:  Estill Bamberg, MD.  ASSISTANTJason Coop, PA-C.  ANESTHESIA:  General endotracheal anesthesia.  COMPLICATIONS:  None.  DISPOSITION:  Stable.  ESTIMATED BLOOD LOSS:  400 mL with 100 mL of blood retransfused via Cell Saver.  INDICATIONS FOR SURGERY:  Briefly, Latoya Smith is a pleasant 51 year old female, who is status post a right-sided L4-5 microdiskectomy procedure. She did well from that surgery, but did postoperatively have recurrence of her pain.  An MRI did reveal recurrence of her herniation, this time on the right side, affecting the right greater than left L5 nerves.  We did go forward with extensive conservative treatment measures, but she did continue to have ongoing pain, mainly in the right leg and intermittently in the left leg.  We therefore did discuss proceeding with the procedure noted above.  The patient was fully aware of the risks and limitations of surgery and did  wish to proceed.  OPERATIVE DETAILS:  On October 28, 2017, the patient was brought to surgery and general endotracheal anesthesia was administered.  The patient was placed prone on a well-padded flat Jackson bed with a Wilson frame.  Antibiotics were given and the back was prepped and draped in the usual sterile fashion and a time-out procedure was performed.  A midline incision was then made overlying the patient's previous incision.  The fascia was incised at the midline.  The paraspinal musculature was bluntly retracted laterally.  I then cannulated the L4 and L5 pedicles bilaterally, using anatomic landmarks, in addition to AP and lateral fluoroscopy, using a cortical trajectory technique.  I also decorticated the facet joints on the right and left sides as well as the posterolateral gutter on the right side, using a high-speed bur.  On the right side, I did tap up to a 5 mm tap at L4 and a 6 mm tap at L5.  The 30 and 35 length screws were placed respectively into the L4 and L5 pedicles.  A 40 mm rod was placed and distraction was applied across the rod and caps were placed and provisionally tightened.  On the left side, a 5 x 30 mm screw was placed into the L4 pedicle.  Bone wax was placed into the L5 pedicle.  At this point, I proceeded with the decompression. I did perform  a thorough and meticulous decompression approaching the left side.  The left L5 nerve was identified and medially retracted. There was noted to be a very prominent large disk herniation, with inferior migration behind the L5 vertebral body, readily identified. This was meticulously removed as aspects of it were adherent to the L5 vertebral body and traversing left L5 nerve.  I was, however, able to thoroughly decompress the nerve.  I then proceeded with the fusion portion of the procedure.  With an assistant holding medial retraction of the left L5 nerve, I did perform a thorough and complete  L4-5 intervertebral diskectomy using a series of curettes and pituitary rongeurs.  The endplates were then prepared and the appropriate size intervertebral spacer was packed with autograft from the decompression, in addition to allograft in the form of ViviGen.  The intervertebral space was liberally packed with allograft and autograft, and after the intervertebral implant was packed, it was inserted into the intervertebral space in the usual manner.  I was very pleased with the press-fit of the implant and the final appearance on both AP and lateral fluoroscopic images.  Prior to placing the bone graft, the wound was copiously irrigated with a total of approximately 2 L of normal saline. The right posterolateral gutter and posterior elements were also packed using autograft and allograft.  The distraction at this point, was released on the contralateral right side.  The left L5 pedicle screw was placed, 6 x 30 mm.  A 40 mm rod was placed on the left, and caps were then placed and a final locking procedure was performed on the right and left sides.  I was very pleased with the final AP and lateral fluoroscopic images.  There was no sustained abnormal EMG activity noted throughout the surgery.  The wound was then closed in layers using #1 Vicryl, followed by 0 Vicryl, followed by 2-0 Vicryl, followed by 4-0 Monocryl.  Benzoin and Steri-Strips were applied, followed by sterile dressing.  All instrument counts were correct at the termination of the procedure.  Of note, Jason Coop, PA-C, was my assistant throughout surgery, and did aid in retraction, suctioning, and closure from start to finish.     Estill Bamberg, MD     MD/MEDQ  D:  10/28/2017  T:  10/29/2017  Job:  [id number]

## 2017-10-29 NOTE — Plan of Care (Signed)
  Progressing Activity: Ability to avoid complications of mobility impairment will improve 10/29/2017 0412 - Progressing by Wille Celeste, RN Ability to tolerate increased activity will improve 10/29/2017 0412 - Progressing by Wille Celeste, RN Will remain free from falls 10/29/2017 0412 - Progressing by Wille Celeste, RN Education: Ability to verbalize activity precautions or restrictions will improve 10/29/2017 0412 - Progressing by Wille Celeste, RN Knowledge of the prescribed therapeutic regimen will improve 10/29/2017 0412 - Progressing by Wille Celeste, RN Understanding of discharge needs will improve 10/29/2017 0412 - Progressing by Wille Celeste, RN Physical Regulation: Ability to maintain clinical measurements within normal limits will improve 10/29/2017 0412 - Progressing by Wille Celeste, RN Postoperative complications will be avoided or minimized 10/29/2017 0412 - Progressing by Wille Celeste, RN Diagnostic test results will improve 10/29/2017 0412 - Progressing by Wille Celeste, RN Pain Management: Pain level will decrease 10/29/2017 0412 - Progressing by Wille Celeste, RN Skin Integrity: Signs of wound healing will improve 10/29/2017 0412 - Progressing by Wille Celeste, RN Health Behavior/Discharge Planning: Identification of resources available to assist in meeting health care needs will improve 10/29/2017 0412 - Progressing by Wille Celeste, RN

## 2017-10-29 NOTE — Evaluation (Signed)
Physical Therapy Evaluation & Discharge Patient Details Name: Latoya Smith MRN: [id number] DOB: [birth year] Today's Date: 10/29/2017   History of Present Illness  Pt is a 51 y.o. female s/p L4-5 TLIF. PMHx: RA, Depression, GERD, HTN, Bil carpal tunnel release, L4-5 microdisectomy 06/2017.  Clinical Impression  Prior to admission, pt reported that she was independent with all functional mobility and ADLs. Pt lives with husband in a single level home. She will have family available 24/7 upon d/c. Pt ambulated in hallway with supervision and use of RW with very slow, cautious but steady gait pattern. Pt successfully completed stair training this session with spouse present as well. PT reviewed 3/3 back precautions with pt throughout. No further acute PT needs identified at this time. PT signing off.    Follow Up Recommendations No PT follow up;Supervision/Assistance - 24 hour    Equipment Recommendations  None recommended by PT    Recommendations for Other Services       Precautions / Restrictions Precautions Precautions: Back Precaution Booklet Issued: No Precaution Comments: Educated pt on 3/3 back precautions Required Braces or Orthoses: Spinal Brace Spinal Brace: Thoracolumbosacral orthotic;Applied in sitting position Restrictions Weight Bearing Restrictions: No      Mobility  Bed Mobility               General bed mobility comments: pt ambulating in hallway with OT and husband upon arrival  Transfers Overall transfer level: Needs assistance Equipment used: None Transfers: Sit to/from Stand Sit to Stand: Supervision         General transfer comment: increased time and effort. cues for hand placement and technique  Ambulation/Gait Ambulation/Gait assistance: Supervision Ambulation Distance (Feet): 300 Feet Assistive device: Rolling walker (2 wheeled) Gait Pattern/deviations: Step-through pattern;Decreased step length - right;Decreased step length -  left;Decreased stride length Gait velocity: decreased Gait velocity interpretation: Below normal speed for age/gender General Gait Details: slow, cautious, steady gait with use of RW. PT provided cueing for posture and to relax shoulders  Stairs Stairs: Yes Stairs assistance: Min assist Stair Management: One rail Left;Step to pattern;Forwards Number of Stairs: 2 General stair comments: pt's spouse present throughout. pt with mild instability with ascending and descending stairs, with preference to lead with R LE going up and L LE coming back down  Wheelchair Mobility    Modified Rankin (Stroke Patients Only)       Balance Overall balance assessment: Needs assistance Sitting-balance support: Feet supported;No upper extremity supported Sitting balance-Leahy Scale: Good     Standing balance support: No upper extremity supported;During functional activity Standing balance-Leahy Scale: Fair Standing balance comment: static standing                             Pertinent Vitals/Pain Pain Assessment: Faces Faces Pain Scale: Hurts even more Pain Location: back Pain Descriptors / Indicators: Sore Pain Intervention(s): Monitored during session;Repositioned    Home Living Family/patient expects to be discharged to:: Private residence Living Arrangements: Spouse/significant other Available Help at Discharge: Family;Available 24 hours/day Type of Home: House Home Access: Stairs to enter   [employer] of Steps: 2 Home Layout: One level Home Equipment: Walker - 2 wheels;Bedside commode      Prior Function Level of Independence: Independent               Hand Dominance        Extremity/Trunk Assessment   Upper Extremity Assessment Upper Extremity Assessment: Defer to OT evaluation  Lower Extremity Assessment Lower Extremity Assessment: LLE deficits/detail LLE Deficits / Details: pt with some functional weakness on L LE with ambulation; pt  also not very trusting of her L LE    Cervical / Trunk Assessment Cervical / Trunk Assessment: Other exceptions Cervical / Trunk Exceptions: s/p spinal sx  Communication   Communication: No difficulties  Cognition Arousal/Alertness: Awake/alert Behavior During Therapy: WFL for tasks assessed/performed Overall Cognitive Status: Within Functional Limits for tasks assessed                                        General Comments      Exercises     Assessment/Plan    PT Assessment Patent does not need any further PT services  PT Problem List         PT Treatment Interventions      PT Goals (Current goals can be found in the Care Plan section)  Acute Rehab PT Goals Patient Stated Goal: return home    Frequency     Barriers to discharge        Co-evaluation               AM-PAC PT "6 Clicks" Daily Activity  Outcome Measure Difficulty turning over in bed (including adjusting bedclothes, sheets and blankets)?: A Little Difficulty moving from lying on back to sitting on the side of the bed? : A Little Difficulty sitting down on and standing up from a chair with arms (e.g., wheelchair, bedside commode, etc,.)?: A Little Help needed moving to and from a bed to chair (including a wheelchair)?: A Little Help needed walking in hospital room?: A Little Help needed climbing 3-5 steps with a railing? : A Little 6 Click Score: 18    End of Session Equipment Utilized During Treatment: Back brace Activity Tolerance: Patient tolerated treatment well Patient left: in chair;with call bell/phone within reach;with family/visitor present Nurse Communication: Mobility status PT Visit Diagnosis: Other abnormalities of gait and mobility (R26.89)    Time: 2423-5361 PT Time Calculation (min) (ACUTE ONLY): 24 min   Charges:   PT Evaluation $PT Eval Moderate Complexity: 1 Mod PT Treatments $Gait Training: 8-22 mins   PT G Codes:        Duck, PT,  DPT [phone]   Latoya Smith 10/29/2017, 10:20 AM

## 2017-10-29 NOTE — Progress Notes (Signed)
Patient is discharged from room 3C09 at this time. Alert and in stable condition. IV site d/c'd and instructions read to patient and spouse with understanding verbalized. Left unit via wheelchair with all belongings at side. 

## 2017-11-03 MED FILL — Sodium Chloride IV Soln 0.9%: INTRAVENOUS | Qty: 1000 | Status: AC

## 2017-11-03 MED FILL — Heparin Sodium (Porcine) Inj 1000 Unit/ML: INTRAMUSCULAR | Qty: 30 | Status: AC

## 2017-11-10 NOTE — Discharge Summary (Signed)
Patient ID: Latoya Smith MRN: [id number] DOB/AGE: [birth year] 51 y.o.  Admit date: 10/28/2017 Discharge date: 10/29/2017  Admission Diagnoses:  Active Problems:   Radiculopathy   Discharge Diagnoses:  Same  Past Medical History:  Diagnosis Date  . Anemia    hx  . Arthritis    rheumatoid  . Complication of anesthesia    "crying when have vailum"  . Depression   . Family history of adverse reaction to anesthesia    valium relaxed too much "stopped breathing in recovery"  . GERD (gastroesophageal reflux disease)   . Hypertension     Surgeries: Procedure(s): LEFT SIDED LUMBAR 4-5 TRANSFORAMINAL LUMBAR INTERBODY FUSION WITH INSTRUMENTATION AND ALLOGRAFT; REQUEST 4.5 HOURS AND FLIP AVAILABLE on 10/28/2017   Consultants: None  Discharged Condition: Improved  Hospital Course: Latoya Smith is an 51 y.o. female who was admitted 10/28/2017 for operative treatment of radiculopathy. Patient has severe unremitting pain that affects sleep, daily activities, and work/hobbies. After pre-op clearance the patient was taken to the operating room on 10/28/2017 and underwent  Procedure(s): LEFT SIDED LUMBAR 4-5 TRANSFORAMINAL LUMBAR INTERBODY FUSION WITH INSTRUMENTATION AND ALLOGRAFT; REQUEST 4.5 HOURS AND FLIP AVAILABLE.    Patient was given perioperative antibiotics:  Anti-infectives (From admission, onward)   Start     Dose/Rate Route Frequency Ordered Stop   10/28/17 2200  acyclovir (ZOVIRAX) tablet 400 mg  Status:  Discontinued     400 mg Oral Every other day 10/28/17 1742 10/29/17 1451   10/28/17 2000  ceFAZolin (ANCEF) IVPB 2g/100 mL premix     2 g 200 mL/hr over 30 Minutes Intravenous Every 8 hours 10/28/17 1743 10/29/17 0518   10/28/17 0811  ceFAZolin (ANCEF) 2-4 GM/100ML-% IVPB    Comments:  Rosenberger, Meredit: cabinet override      10/28/17 0811 10/28/17 1156   10/28/17 0809  ceFAZolin (ANCEF) IVPB 2g/100 mL premix     2 g 200 mL/hr over 30 Minutes Intravenous On  call to O.R. 10/28/17 0809 10/28/17 1201       Patient was given sequential compression devices, early ambulation to prevent DVT.  Patient benefited maximally from hospital stay and there were no complications.    Recent vital signs: BP 117/74 (BP Location: Right Arm)   Pulse 91   Temp 98.3 F (36.8 C) (Oral)   Resp 18   Ht 5\' 3"  (1.6 m)   Wt 75.8 kg (167 lb)   SpO2 100%   BMI 29.58 kg/m    Discharge Medications:   Allergies as of 10/29/2017      Reactions   Percocet [oxycodone-acetaminophen] Other (See Comments)   migraines   Valium [diazepam] Other (See Comments)   Cries uncontrollably       Medication List    TAKE these medications   acetaminophen 500 MG tablet Commonly known as:  TYLENOL Take 1,000 mg every 6 (six) hours as needed by mouth for moderate pain or headache.   ACTEMRA IV Inject 8 mg/kg every 28 (twenty-eight) days into the vein.   acyclovir 400 MG tablet Commonly known as:  ZOVIRAX Take 400 mg by mouth every other day.   buPROPion 100 MG tablet Commonly known as:  WELLBUTRIN Take 100 mg by mouth 2 (two) times daily.   CELEBRATE CALCIUM PLUS 500 PO Take 1-2 tablets by mouth See admin instructions. Takes 1 tablet in morning and 2 tablet in the evening   diphenhydrAMINE 25 mg capsule Commonly known as:  BENADRYL Take 50 mg by mouth  at bedtime.   etodolac 400 MG tablet Commonly known as:  LODINE Take 1 tablet (400 mg total) by mouth 2 (two) times daily.   ferrous sulfate 325 (65 FE) MG tablet Take 325 mg by mouth daily with breakfast. Not sure of mg   hydrochlorothiazide 12.5 MG capsule Commonly known as:  MICROZIDE Take 12.5 mg by mouth daily.   IRON PO Take 1 tablet daily by mouth.   leflunomide 20 MG tablet Commonly known as:  ARAVA Take 20 mg by mouth at bedtime.   levonorgestrel 20 MCG/24HR IUD Commonly known as:  MIRENA 1 each by Intrauterine route once.   loratadine 10 MG tablet Commonly known as:  CLARITIN Take 10 mg by  mouth daily.   losartan 50 MG tablet Commonly known as:  COZAAR Take 25 mg daily by mouth.   methocarbamol 500 MG tablet Commonly known as:  ROBAXIN Take 1 tablet (500 mg total) every 6 (six) hours as needed by mouth for muscle spasms.   neomycin-bacitracin-polymyxin ointment Commonly known as:  NEOSPORIN Apply 1 application as needed topically for wound care. apply to eye   omeprazole 20 MG capsule Commonly known as:  PRILOSEC Take 20 mg by mouth daily.   predniSONE 5 MG tablet Commonly known as:  DELTASONE Take 5 mg daily with breakfast by mouth. Cut to 2.5mg  for surgery   traMADol 50 MG tablet Commonly known as:  ULTRAM Take 50-100 mg every 6 (six) hours as needed by mouth for moderate pain.   zolpidem 10 MG tablet Commonly known as:  AMBIEN Take 10 mg by mouth at bedtime.       Diagnostic Studies: Dg Chest 2 View  Result Date: 10/25/2017 CLINICAL DATA:  Preop L4-5 fusion. EXAM: CHEST  2 VIEW COMPARISON:  None. FINDINGS: The heart size and mediastinal contours are within normal limits. Both lungs are clear. The visualized skeletal structures are unremarkable. IMPRESSION: No active cardiopulmonary disease. Electronically Signed   By: Elige Ko   On: 10/25/2017 10:20   Dg Lumbar Spine 2-3 Views  Result Date: 10/28/2017 CLINICAL DATA:  L4-5 TLIF. EXAM: DG C-ARM 61-120 MIN; LUMBAR SPINE - 2-3 VIEW COMPARISON:  Intraoperative lumbar spine radiographs from the same day. FINDINGS: Bilateral posterior pedicle screw fixation is present at L4-5. Disc spacer is in place. Alignment is stable. IMPRESSION: Interval fusion at L4-5 without radiographic evidence for complication. Electronically Signed   By: Marin Roberts M.D.   On: 10/28/2017 15:26   Dg Lumbar Spine 1 View  Result Date: 10/28/2017 CLINICAL DATA:  51 year old female undergoing lumbar surgery. EXAM: LUMBAR SPINE - 1 VIEW COMPARISON:  Lumbar MRI 07/09/2017 and earlier. FINDINGS: Same numbering system on the  prior MRI and also intraoperative images on 07/01/2017 designating normal lumbar segmentation. Intraoperative portable cross-table lateral view of the lumbar spine labeled image #1 at 1207 hours. There is a needle tip projecting over the L3 spinous process. There is a second more caudal needle directed toward the L4-L5 disc space superficial to the L4 and L5 spinous processes. IMPRESSION: Intraoperative localization at both the L3 spinous process and the L4-L5 levels. Electronically Signed   By: Odessa Fleming M.D.   On: 10/28/2017 14:11   Dg C-arm 61-120 Min  Result Date: 10/28/2017 CLINICAL DATA:  L4-5 TLIF. EXAM: DG C-ARM 61-120 MIN; LUMBAR SPINE - 2-3 VIEW COMPARISON:  Intraoperative lumbar spine radiographs from the same day. FINDINGS: Bilateral posterior pedicle screw fixation is present at L4-5. Disc spacer is in place. Alignment is  stable. IMPRESSION: Interval fusion at L4-5 without radiographic evidence for complication. Electronically Signed   By: Marin Roberts M.D.   On: 10/28/2017 15:26    Disposition: 01-Home or Self Care  Discharge Instructions    Discharge patient   Complete by:  As directed    D/C Instructions printed and placed in paper chart D/C scripts signed and placed in paper chart Back precautions at all times F/U in office 2 weeks   Discharge disposition:  01-Home or Self Care   Discharge patient date:  10/29/2017     POD #1 s/p revision decompression and fusion doing excellent   - up with PT/OT, encourage ambulation             -D/C after PT - Dilaudid for pain and Robaxin for muscle spasms -Written scripts for pain signed and in chart -D/C instructions sheet printed and in chart -D/C today  -F/U in office 2 weeks   Signed: Georga Bora 11/10/2017, 2:15 PM

## 2018-03-17 IMAGING — CT CT HEAD W/O CM
3 series · 15 of 47 positions shown, 18 images · non-contrast
Comparison: None.

CLINICAL DATA: Headache after spine surgery eight days ago. Nausea.

EXAM:
CT HEAD WITHOUT CONTRAST
TECHNIQUE: Contiguous axial images were obtained from the base of the skull
through the vertex without intravenous contrast.

[Series 2: head wo · axial · 0.44mm/px · z∈[+884,+1009]mm · 9 of 31 slices shown, 12 images]
[im 3/31  brain]
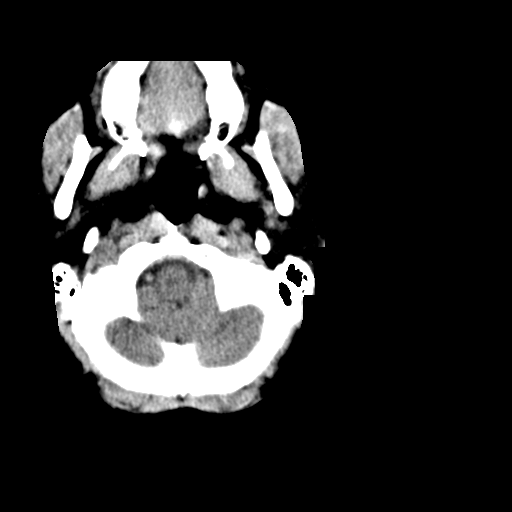
[im 3/31  bone]
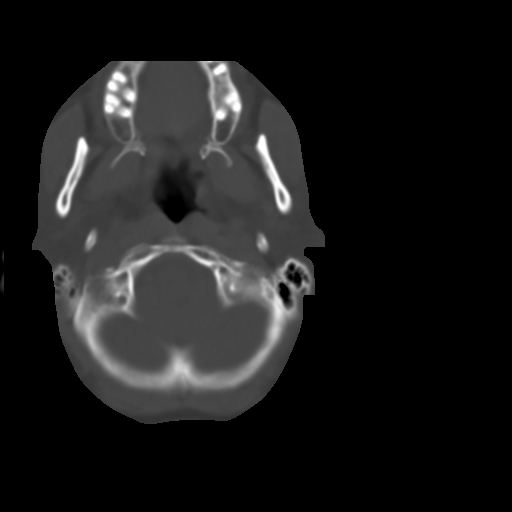
[im 6/31  brain]
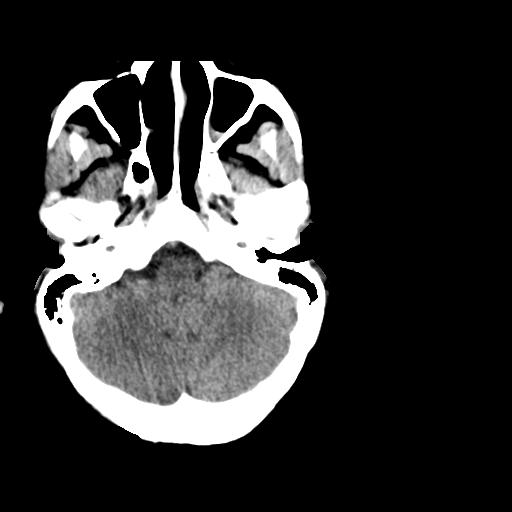
[im 9/31  brain]
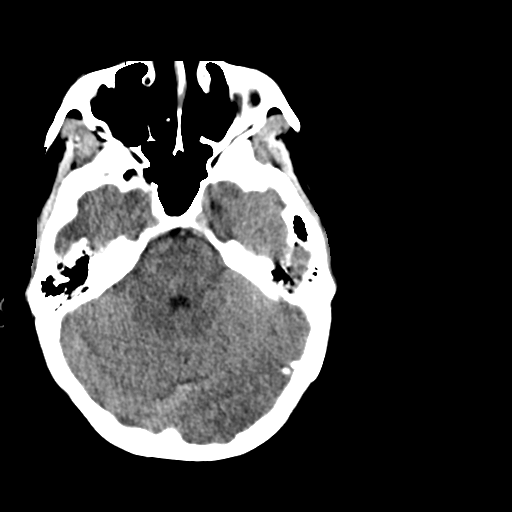
[im 12/31  brain]
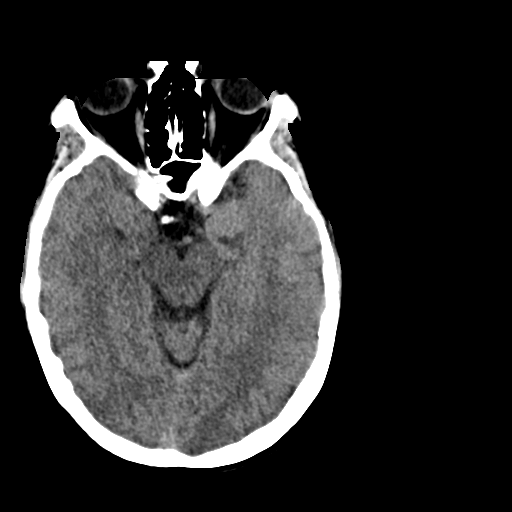
[im 16/31  brain]
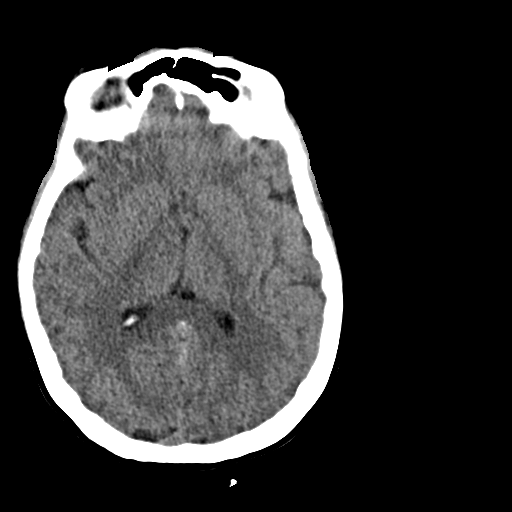
[im 16/31  bone]
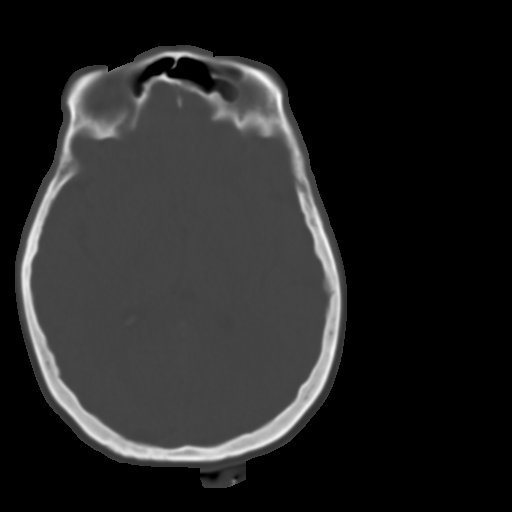
[im 19/31  brain]
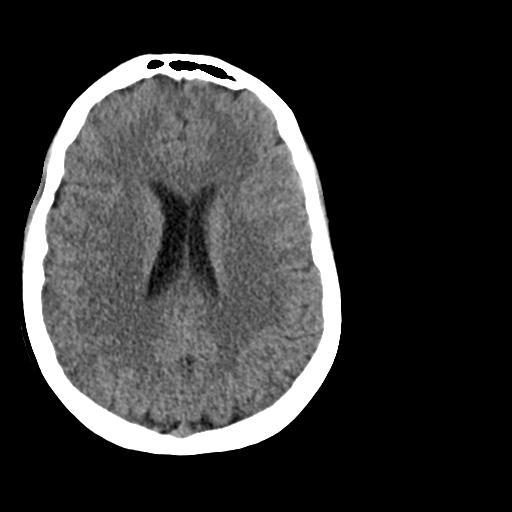
[im 22/31  brain]
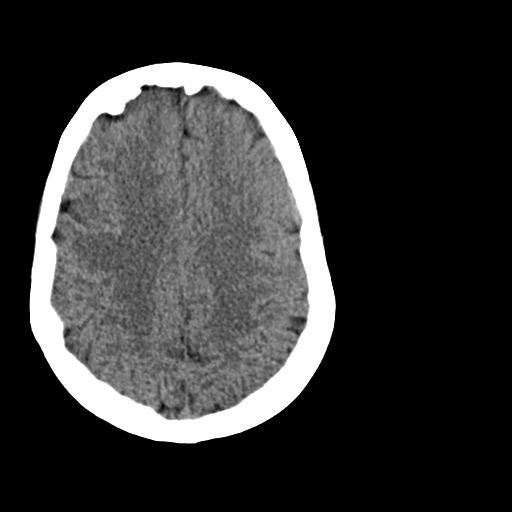
[im 25/31  brain]
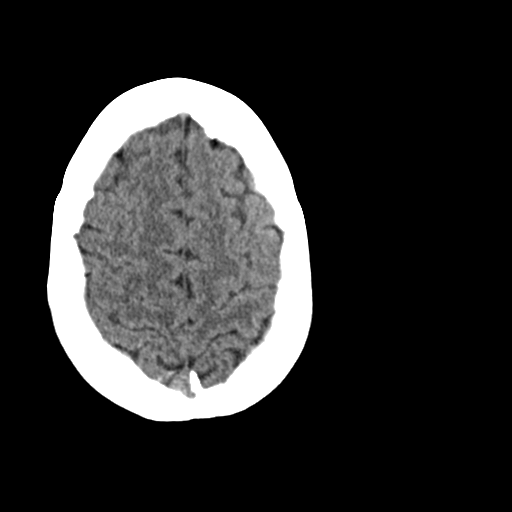
[im 28/31  brain]
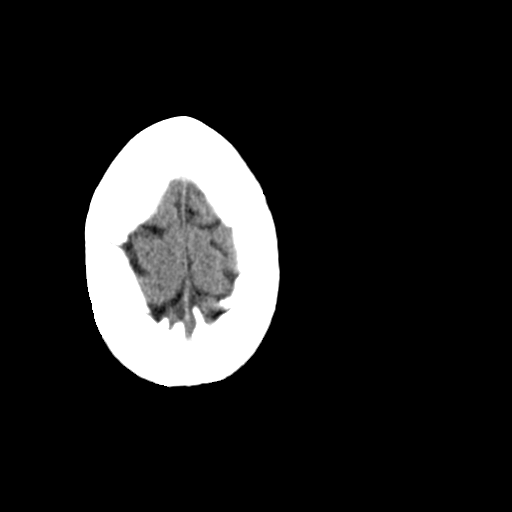
[im 28/31  bone]
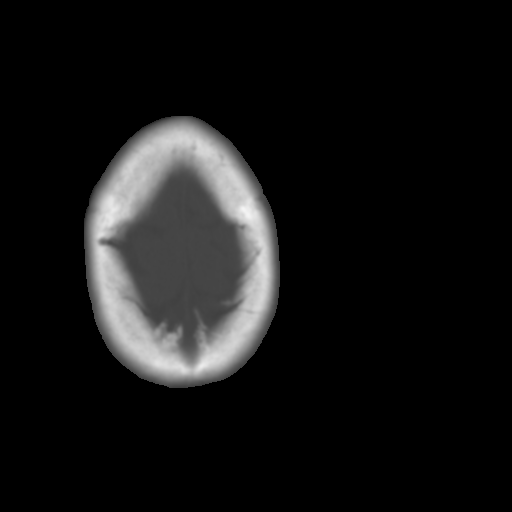

[Series 4: coronal soft · coronal · 0.32mm/px · 3 of 71 slices shown]
[im 24/71  brain]
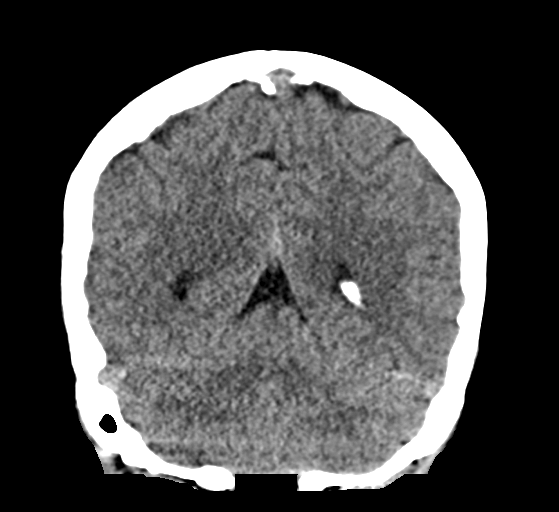
[im 32/71  brain]
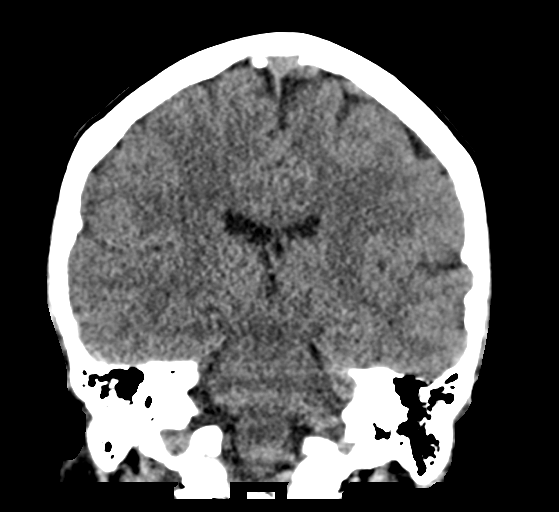
[im 39/71  brain]
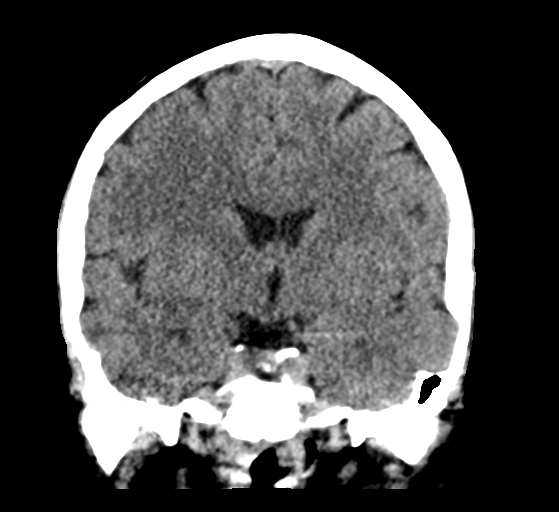

[Series 5: sag soft · sagittal · 0.30mm/px · 3 of 67 slices shown]
[im 23/67  brain]
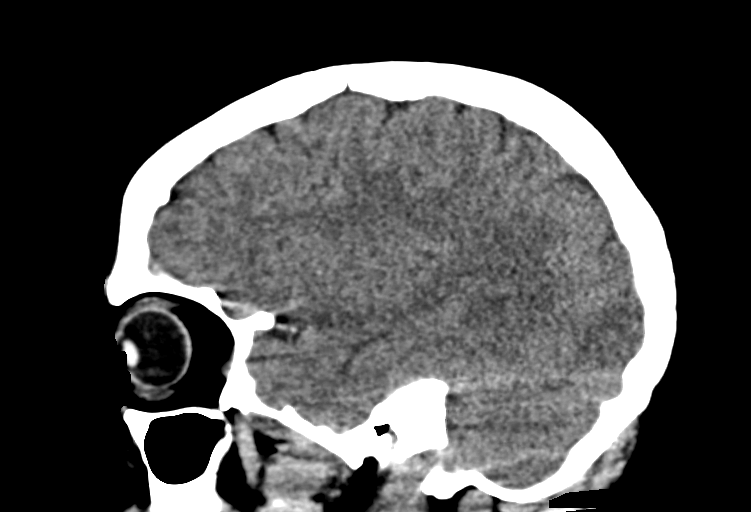
[im 34/67  brain]
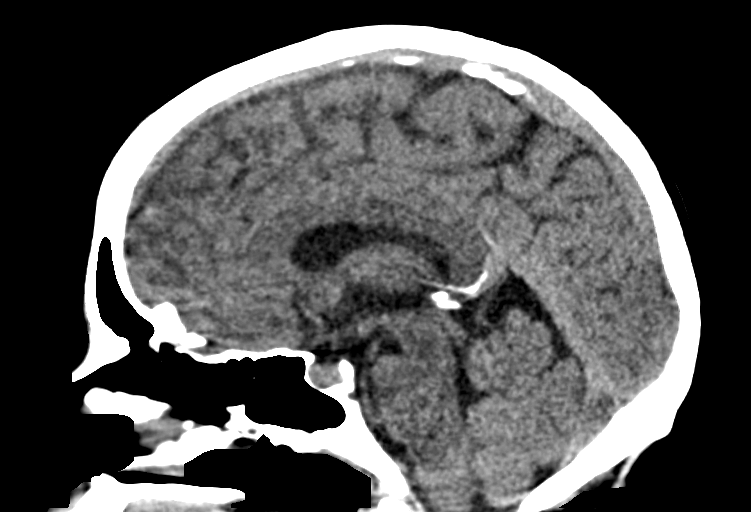
[im 45/67  brain]
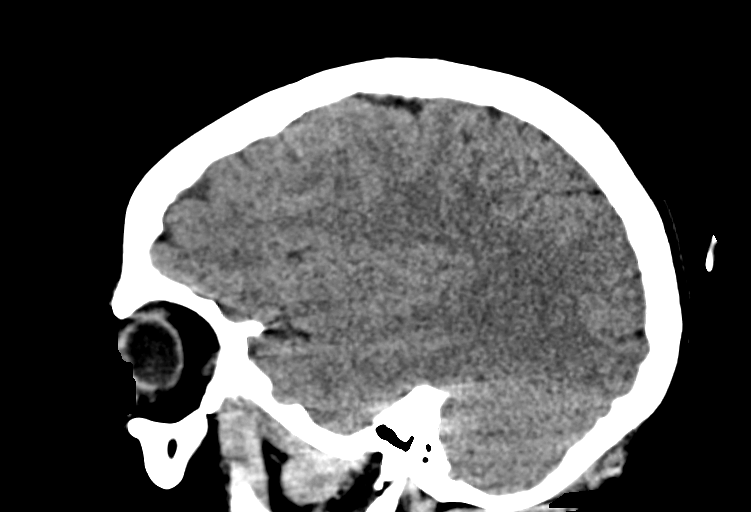

[15 of 47 positions shown; findings below may reference images not displayed]

FINDINGS: Brain: There is no intracranial hemorrhage, mass or evidence of
acute infarction. There is no extra-axial fluid collection. Gray
matter and white matter appear normal. Cerebral volume is normal for
age. Brainstem and posterior fossa are unremarkable. The CSF spaces
appear normal.

Vascular: No hyperdense vessel or unexpected calcification.

Skull: Normal. Negative for fracture or focal lesion.

Sinuses/Orbits: Mild left maxillary sinus membrane thickening. No
air-fluid level.

Other: None.
IMPRESSION: 1. Normal brain
2. Mild left maxillary sinus membrane thickening, indeterminate
chronicity.

## 2023-02-08 ENCOUNTER — Other Ambulatory Visit: Payer: Self-pay | Admitting: Orthopedic Surgery

## 2023-02-08 DIAGNOSIS — M545 Low back pain, unspecified: Secondary | ICD-10-CM

## 2023-02-09 ENCOUNTER — Encounter: Payer: Self-pay | Admitting: Orthopedic Surgery

## 2023-03-09 ENCOUNTER — Ambulatory Visit
Admission: RE | Admit: 2023-03-09 | Discharge: 2023-03-09 | Disposition: A | Discharge status: Home / Self Care | Payer: BC Managed Care – PPO | Source: Ambulatory Visit | Attending: Orthopedic Surgery | Admitting: Orthopedic Surgery

## 2023-03-09 DIAGNOSIS — M545 Low back pain, unspecified: Secondary | ICD-10-CM
